# Patient Record
Sex: Male | Born: 1965 | Race: Black or African American | Hispanic: No | Marital: Married | Smoking: Never smoker
Health system: Southern US, Community
[De-identification: ages and names within clinical notes are randomized; demographics above are authoritative.]

## PROBLEM LIST (undated history)

## (undated) DIAGNOSIS — E785 Hyperlipidemia, unspecified: Secondary | ICD-10-CM

## (undated) DIAGNOSIS — S02609B Fracture of mandible, unspecified, initial encounter for open fracture: Secondary | ICD-10-CM

## (undated) DIAGNOSIS — I10 Essential (primary) hypertension: Secondary | ICD-10-CM

## (undated) DIAGNOSIS — E119 Type 2 diabetes mellitus without complications: Secondary | ICD-10-CM

## (undated) HISTORY — PX: FACIAL FRACTURE SURGERY: SHX1570

## (undated) HISTORY — DX: Hyperlipidemia, unspecified: E78.5

## (undated) HISTORY — DX: Essential (primary) hypertension: I10

## (undated) HISTORY — DX: Type 2 diabetes mellitus without complications: E11.9

## (undated) HISTORY — PX: KNEE ARTHROSCOPY: SUR90

---

## 1998-02-07 ENCOUNTER — Emergency Department (HOSPITAL_COMMUNITY): Admission: EM | Admit: 1998-02-07 | Discharge: 1998-02-07 | Payer: Self-pay | Admitting: Emergency Medicine

## 2001-12-21 ENCOUNTER — Emergency Department (HOSPITAL_COMMUNITY): Admission: EM | Admit: 2001-12-21 | Discharge: 2001-12-21 | Payer: Self-pay | Admitting: Emergency Medicine

## 2002-09-21 ENCOUNTER — Emergency Department (HOSPITAL_COMMUNITY): Admission: EM | Admit: 2002-09-21 | Discharge: 2002-09-21 | Payer: Self-pay | Admitting: Psychiatry

## 2008-02-14 ENCOUNTER — Emergency Department (HOSPITAL_COMMUNITY): Admission: EM | Admit: 2008-02-14 | Discharge: 2008-02-15 | Payer: Self-pay | Admitting: Emergency Medicine

## 2008-02-19 ENCOUNTER — Emergency Department (HOSPITAL_COMMUNITY): Admission: EM | Admit: 2008-02-19 | Discharge: 2008-02-19 | Payer: Self-pay | Admitting: Emergency Medicine

## 2008-08-15 ENCOUNTER — Emergency Department (HOSPITAL_COMMUNITY): Admission: EM | Admit: 2008-08-15 | Discharge: 2008-08-15 | Payer: Self-pay | Admitting: Emergency Medicine

## 2008-09-08 ENCOUNTER — Emergency Department (HOSPITAL_COMMUNITY): Admission: EM | Admit: 2008-09-08 | Discharge: 2008-09-08 | Payer: Self-pay | Admitting: Emergency Medicine

## 2009-07-19 IMAGING — CT CT ABDOMEN W/O CM
2 of 4 series · 17 of 46 positions shown, 19 images · non-contrast
Comparison: None

CT ABDOMEN

CLINICAL DATA: Right flank pain.

CT ABDOMEN AND PELVIS WITHOUT CONTRAST
TECHNIQUE: Multidetector CT imaging of the abdomen and pelvis was
performed following the standard protocol without intravenous
contrast.

[Series 2: 220 stone 5.0 b40f st · axial · 0.66mm/px · z∈[+829,+1194]mm · 14 of 81 slices shown, 16 images]
[im 4/81  soft-tissue]
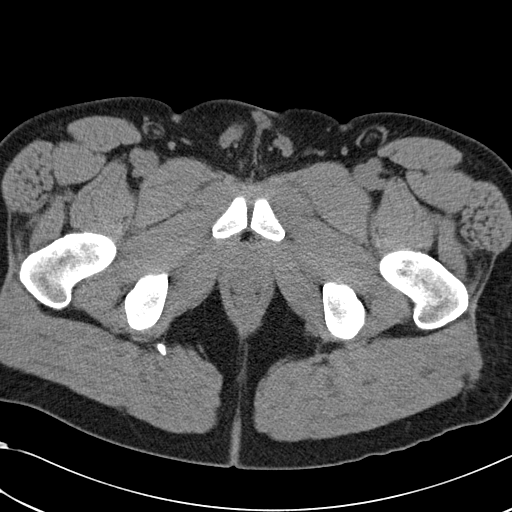
[im 4/81  bone]
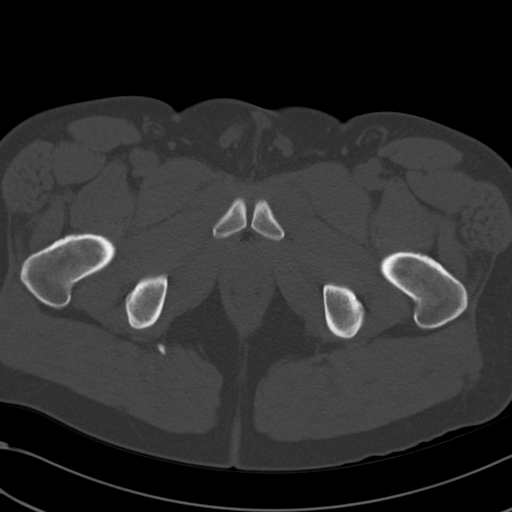
[im 10/81  soft-tissue]
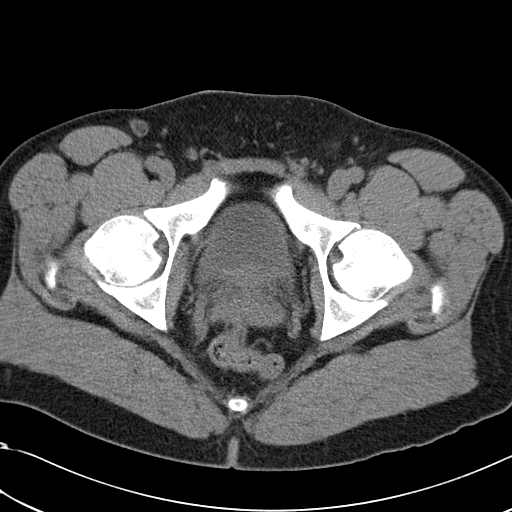
[im 17/81  soft-tissue]
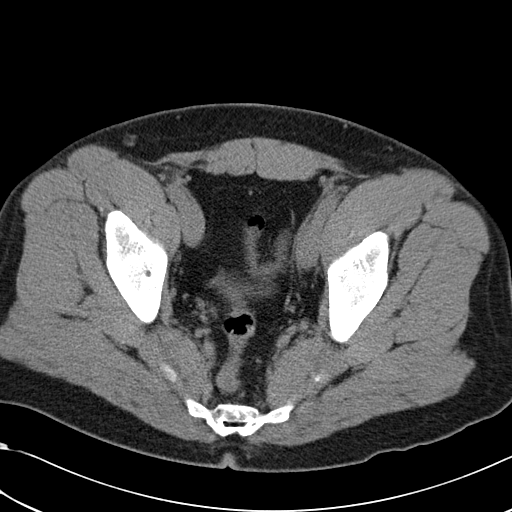
[im 23/81  soft-tissue]
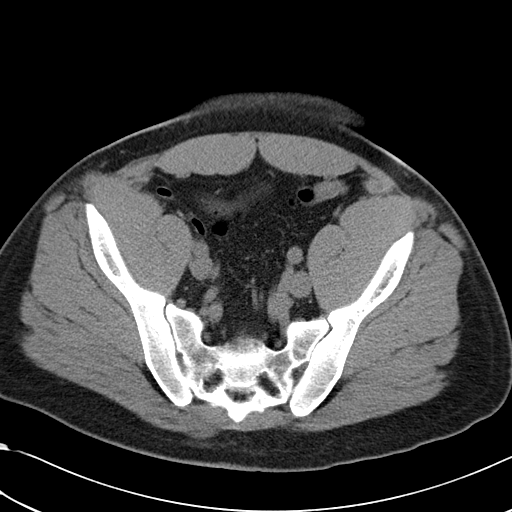
[im 26/81  soft-tissue]
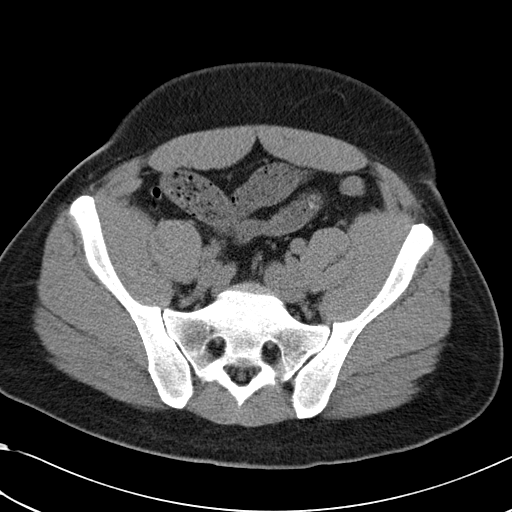
[im 33/81  soft-tissue]
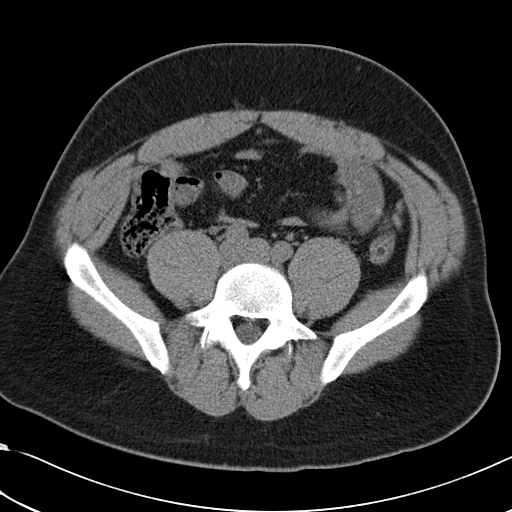
[im 39/81  soft-tissue]
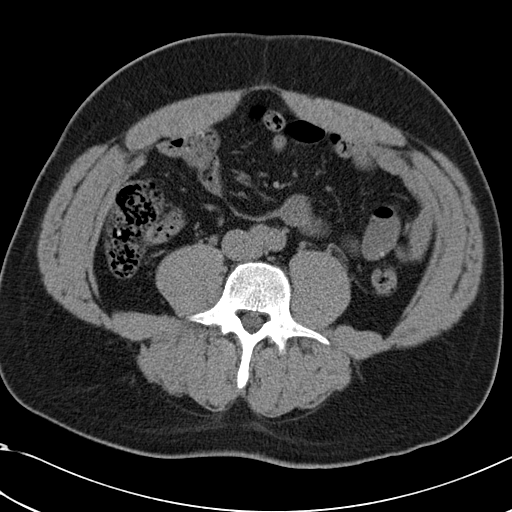
[im 42/81  soft-tissue]
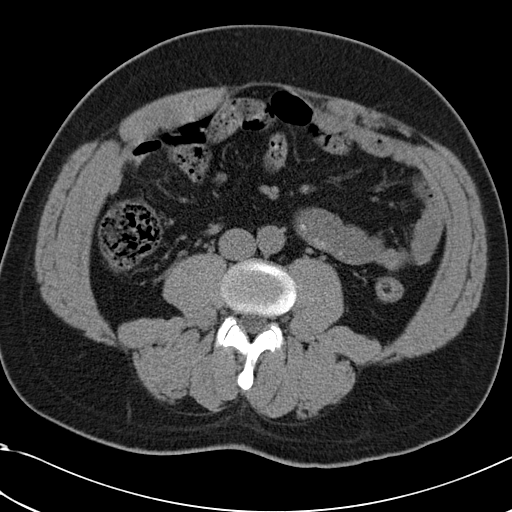
[im 49/81  soft-tissue]
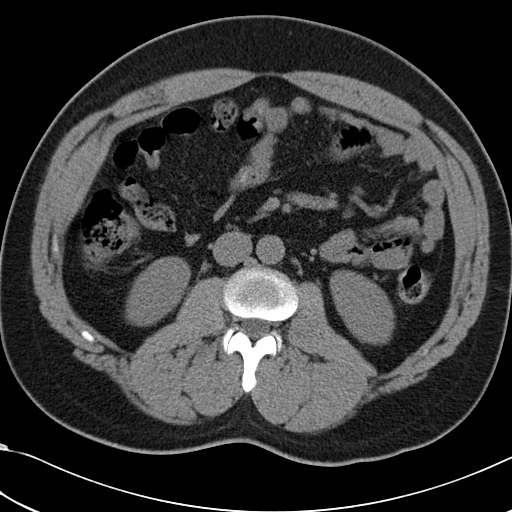
[im 49/81  bone]
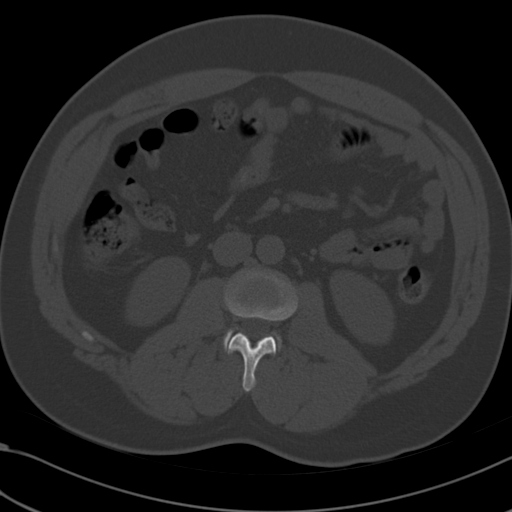
[im 55/81  soft-tissue]
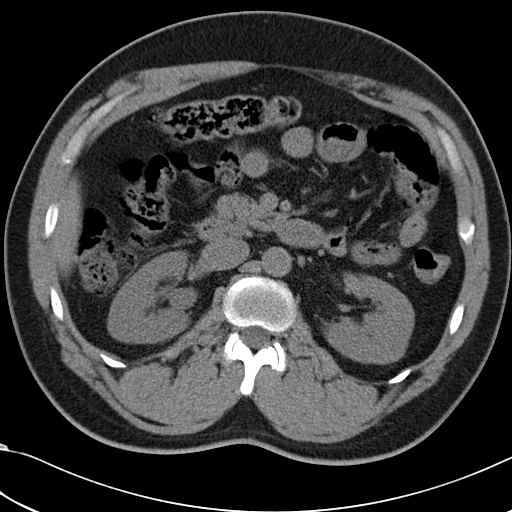
[im 61/81  soft-tissue]
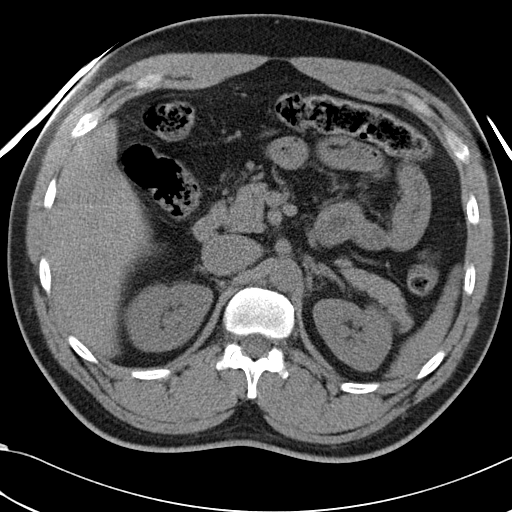
[im 65/81  soft-tissue]
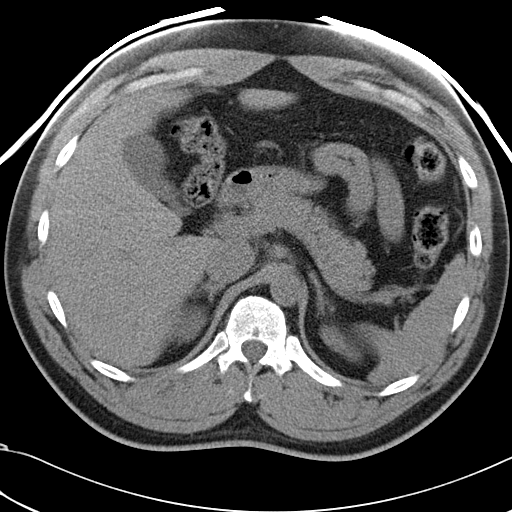
[im 71/81  soft-tissue]
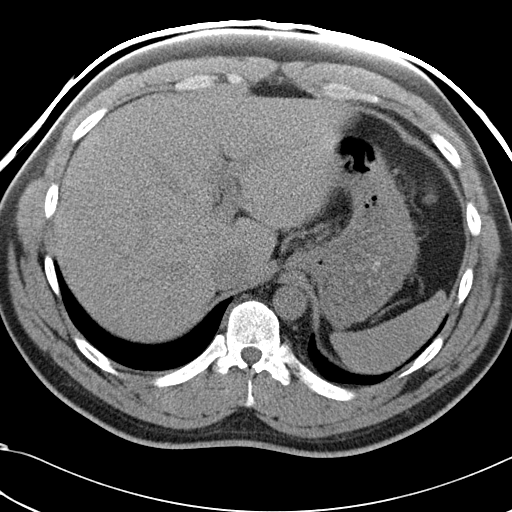
[im 77/81  soft-tissue]
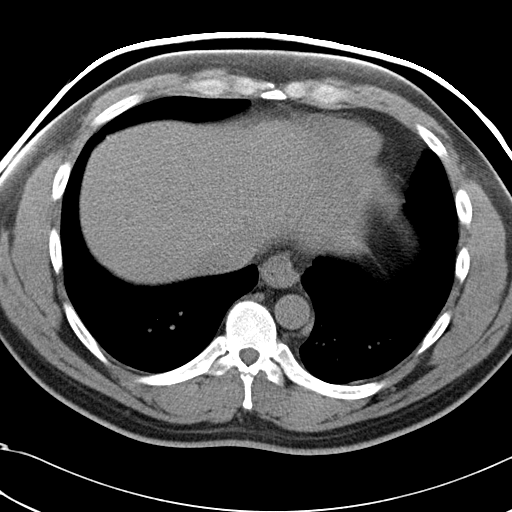

[Series 602: <mpr thick range> · coronal · 0.82mm/px · 3 of 78 slices shown]
[im 26/78  soft-tissue]
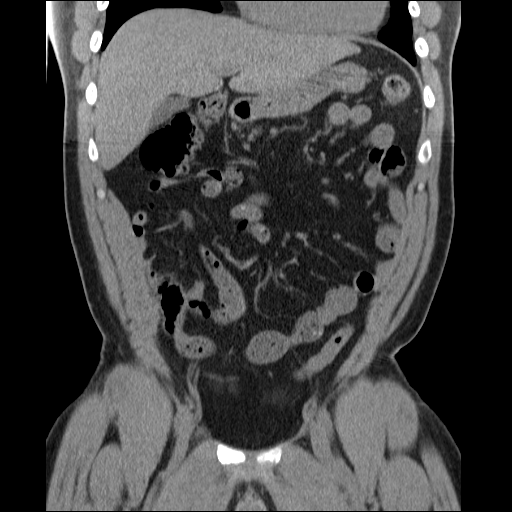
[im 35/78  soft-tissue]
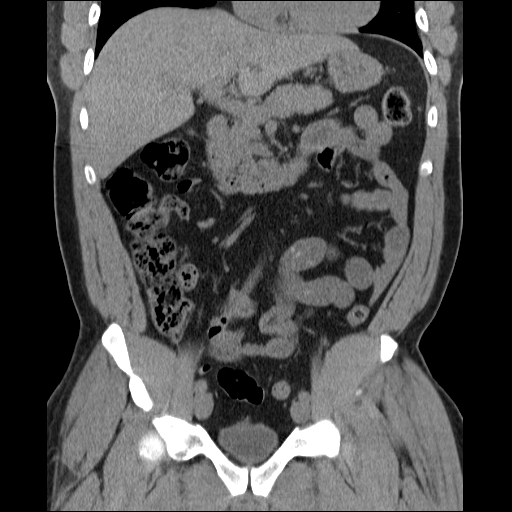
[im 43/78  soft-tissue]
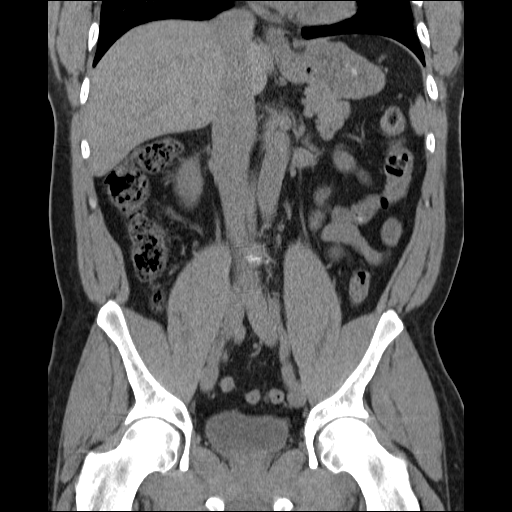

[17 of 46 positions shown; findings below may reference images not displayed]

FINDINGS: The lung bases are clear

The unenhanced appearance of the liver and spleen are unremarkable.
The pancreas is grossly normal.  The adrenal glands and left kidney
are normal.  The right kidney demonstrates mild hydronephrosis and
there is a small lower pole right renal calculus.  Mild right
hydroureter.  No upper ureteral calculi.

The stomach, duodenum, small bowel and colon demonstrate no
significant abnormalities.  The study is limited without oral
contrast.  No mesenteric or retroperitoneal masses or adenopathy.
The aorta is normal in caliber.  No significant bony findings.
IMPRESSION: 1.  Mild right-sided hydroureteronephrosis consistent with a low
grade obstruction.
2.  Small lower pole right renal calculus.
3.  No mass lesions or adenopathy.
4.  Small hiatal hernia.

CT PELVIS
FINDINGS: There is a tiny, 1- 2 mm calculus at the right UVJ
causing the mild right-sided hydroureteronephrosis.  No bladder
calculi.  The rectum, sigmoid colon visualized small bowel loops
unremarkable.  The appendix is visualized and is normal.  The bony
pelvis is intact.  No inguinal mass or hernia.
IMPRESSION: 1-2 mm right UVJ calculus causing mild right-sided
hydroureteronephrosis.

## 2010-09-19 ENCOUNTER — Emergency Department (HOSPITAL_COMMUNITY)
Admission: EM | Admit: 2010-09-19 | Discharge: 2010-09-19 | Disposition: A | Discharge status: Home / Self Care | Payer: PRIVATE HEALTH INSURANCE | Attending: Emergency Medicine | Admitting: Emergency Medicine

## 2010-09-19 DIAGNOSIS — R109 Unspecified abdominal pain: Secondary | ICD-10-CM | POA: Insufficient documentation

## 2010-09-19 DIAGNOSIS — R110 Nausea: Secondary | ICD-10-CM | POA: Insufficient documentation

## 2010-09-19 DIAGNOSIS — N23 Unspecified renal colic: Secondary | ICD-10-CM | POA: Insufficient documentation

## 2010-09-19 LAB — POCT I-STAT, CHEM 8
BUN: 19 mg/dL (ref 6–23)
Calcium, Ion: 1.2 mmol/L (ref 1.12–1.32)
Chloride: 104 meq/L (ref 96–112)
Creatinine, Ser: 1.3 mg/dL (ref 0.4–1.5)
Glucose, Bld: 91 mg/dL (ref 70–99)
HCT: 50 % (ref 39.0–52.0)
Hemoglobin: 17 g/dL (ref 13.0–17.0)
Potassium: 4.2 meq/L (ref 3.5–5.1)
Sodium: 141 meq/L (ref 135–145)
TCO2: 28 mmol/L (ref 0–100)

## 2010-09-19 LAB — URINALYSIS, ROUTINE W REFLEX MICROSCOPIC
Bilirubin Urine: NEGATIVE
Hgb urine dipstick: NEGATIVE
Protein, ur: NEGATIVE mg/dL
Urobilinogen, UA: 1 mg/dL (ref 0.0–1.0)

## 2010-09-19 LAB — CBC
HCT: 46.9 % (ref 39.0–52.0)
Hemoglobin: 15.2 g/dL (ref 13.0–17.0)
MCH: 26.3 pg (ref 26.0–34.0)
MCHC: 32.4 g/dL (ref 30.0–36.0)
MCV: 81.3 fL (ref 78.0–100.0)
Platelets: 244 K/uL (ref 150–400)
RBC: 5.77 MIL/uL (ref 4.22–5.81)
RDW: 13.3 % (ref 11.5–15.5)
WBC: 7.1 K/uL (ref 4.0–10.5)

## 2010-09-19 LAB — DIFFERENTIAL
Basophils Absolute: 0 10*3/uL (ref 0.0–0.1)
Eosinophils Relative: 1 % (ref 0–5)
Lymphocytes Relative: 42 % (ref 12–46)
Monocytes Absolute: 0.5 10*3/uL (ref 0.1–1.0)

## 2010-09-21 ENCOUNTER — Emergency Department (HOSPITAL_COMMUNITY): Payer: PRIVATE HEALTH INSURANCE

## 2010-09-21 ENCOUNTER — Emergency Department (HOSPITAL_COMMUNITY)
Admission: EM | Admit: 2010-09-21 | Discharge: 2010-09-21 | Disposition: A | Discharge status: Home / Self Care | Payer: PRIVATE HEALTH INSURANCE | Attending: Emergency Medicine | Admitting: Emergency Medicine

## 2010-09-21 DIAGNOSIS — R109 Unspecified abdominal pain: Secondary | ICD-10-CM | POA: Insufficient documentation

## 2010-09-21 DIAGNOSIS — M79609 Pain in unspecified limb: Secondary | ICD-10-CM | POA: Insufficient documentation

## 2010-09-21 DIAGNOSIS — R209 Unspecified disturbances of skin sensation: Secondary | ICD-10-CM | POA: Insufficient documentation

## 2010-09-21 DIAGNOSIS — R112 Nausea with vomiting, unspecified: Secondary | ICD-10-CM | POA: Insufficient documentation

## 2010-09-21 DIAGNOSIS — M25559 Pain in unspecified hip: Secondary | ICD-10-CM | POA: Insufficient documentation

## 2010-09-21 LAB — URINALYSIS, ROUTINE W REFLEX MICROSCOPIC
Bilirubin Urine: NEGATIVE
Hgb urine dipstick: NEGATIVE
Ketones, ur: NEGATIVE mg/dL
Protein, ur: NEGATIVE mg/dL
Urine Glucose, Fasting: NEGATIVE mg/dL

## 2010-11-14 LAB — POCT I-STAT, CHEM 8
BUN: 24 mg/dL — ABNORMAL HIGH (ref 6–23)
Calcium, Ion: 1.17 mmol/L (ref 1.12–1.32)
Creatinine, Ser: 1.6 mg/dL — ABNORMAL HIGH (ref 0.4–1.5)
TCO2: 28 mmol/L (ref 0–100)

## 2011-01-05 ENCOUNTER — Emergency Department (HOSPITAL_COMMUNITY)
Admission: EM | Admit: 2011-01-05 | Discharge: 2011-01-05 | Disposition: A | Discharge status: Home / Self Care | Payer: PRIVATE HEALTH INSURANCE | Attending: Emergency Medicine | Admitting: Emergency Medicine

## 2011-01-05 DIAGNOSIS — R319 Hematuria, unspecified: Secondary | ICD-10-CM | POA: Insufficient documentation

## 2011-01-05 DIAGNOSIS — R109 Unspecified abdominal pain: Secondary | ICD-10-CM | POA: Insufficient documentation

## 2011-01-05 DIAGNOSIS — N200 Calculus of kidney: Secondary | ICD-10-CM | POA: Insufficient documentation

## 2011-01-05 LAB — URINE MICROSCOPIC-ADD ON

## 2011-01-05 LAB — URINALYSIS, ROUTINE W REFLEX MICROSCOPIC
Glucose, UA: NEGATIVE mg/dL
Hgb urine dipstick: NEGATIVE
Ketones, ur: NEGATIVE mg/dL
Protein, ur: NEGATIVE mg/dL

## 2011-04-27 LAB — BASIC METABOLIC PANEL
BUN: 14
Chloride: 106
Glucose, Bld: 99
Potassium: 4.3
Sodium: 140

## 2011-04-27 LAB — MAGNESIUM: Magnesium: 1.9

## 2013-01-07 ENCOUNTER — Telehealth: Payer: Self-pay

## 2013-01-07 ENCOUNTER — Ambulatory Visit: Payer: Self-pay | Admitting: Family Medicine

## 2013-01-07 VITALS — BP 111/75 | HR 90 | Temp 98.3°F | Resp 16 | Ht 68.5 in | Wt 220.0 lb

## 2013-01-07 DIAGNOSIS — T148XXA Other injury of unspecified body region, initial encounter: Secondary | ICD-10-CM

## 2013-01-07 NOTE — Progress Notes (Signed)
[facility]:  Office Visit  Chief Complaint:  Chief Complaint  Patient presents with  . Leg Pain    felt a pop    HPI: Billy Rios is a 47 y.o. male who complains of  Softball tournament, was chasing after another player and heard something popped and was not able to move. RICE, no swelling. 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 Pain with weightbearing and walking. Can dorsi and plantarflex  History reviewed. No pertinent past medical history. History reviewed. No pertinent past surgical history. History   Social History  . Marital Status: Married    Spouse Name: N/A    Number of Children: N/A  . Years of Education: N/A   Social History Main Topics  . Smoking status: Never Smoker   . Smokeless tobacco: None  . Alcohol Use: No  . Drug Use: No  . Sexually Active: Yes    Birth Control/ Protection: None   Other Topics Concern  . None   Social History Narrative  . None   History reviewed. No pertinent family history. No Known Allergies Prior to Admission medications   Not on File     ROS: The patient denies fevers, chills, night sweats, unintentional weight loss, chest pain, palpitations, wheezing, dyspnea on exertion, nausea, vomiting, abdominal pain, dysuria, hematuria, melena, numbness,  or tingling.   All other systems have been reviewed and were otherwise negative with the exception of those mentioned in the HPI and as above.    PHYSICAL EXAM: Filed Vitals:   01/07/13 1126  BP: 111/75  Pulse: 90  Temp: 98.3 F (36.8 C)  Resp: 16   Filed Vitals:   01/07/13 1126  Height: 5' 8.5" (1.74 m)  Weight: 220 lb (99.791 kg)   Body mass index is 32.96 kg/(m^2).  General: Alert, no acute distress HEENT:  Normocephalic, atraumatic, oropharynx patent.  Cardiovascular:  Regular rate and rhythm, no rubs murmurs or gallops.  No Carotid bruits, radial pulse intact. No pedal edema.  Respiratory: Clear to auscultation bilaterally.  No wheezes,  rales, or rhonchi.  No cyanosis, no use of accessory musculature GI: No organomegaly, abdomen is soft and non-tender, positive bowel sounds.  No masses. Skin: No rashes. Neurologic: Facial musculature symmetric. Psychiatric: Patient is appropriate throughout our interaction. Lymphatic: No cervical lymphadenopathy Musculoskeletal: Gait intact with help of crutches, no obvious swelling + tenderness medal aspect at achilles tendon insertion at gastroc  + DP, 5/5 stregnth, able to dorsi and plantar flex, able to bear weight and shift.    LABS: Results for orders placed during the hospital encounter of 01/05/11  URINALYSIS, ROUTINE W REFLEX MICROSCOPIC      Result Value Range   Color, Urine YELLOW  YELLOW   APPearance TURBID (*) CLEAR   Specific Gravity, Urine 1.034 (*) 1.005 - 1.030   pH 5.5  5.0 - 8.0   Glucose, UA NEGATIVE  NEGATIVE mg/dL   Hgb urine dipstick NEGATIVE  NEGATIVE   Bilirubin Urine NEGATIVE  NEGATIVE   Ketones, ur NEGATIVE  NEGATIVE mg/dL   Protein, ur NEGATIVE  NEGATIVE mg/dL   Urobilinogen, UA 0.2  0.0 - 1.0 mg/dL   Nitrite NEGATIVE  NEGATIVE   Leukocytes, UA NEGATIVE  NEGATIVE  URINE MICROSCOPIC-ADD ON      Result Value Range   Urine-Other AMORPHOUS URATES/PHOSPHATES       EKG/XRAY:   Primary read interpreted by Dr. Conley Rolls at [facility].   ASSESSMENT/PLAN: Encounter Diagnosis  Name Primary?  . Sprain  and strain Yes   ? Gastro strain/sprain Unlikely achilles rupture Compressions ICE Ibuprofen NSAIDs  Nonweightbearing, note given for 1 week Xrays deferred unless worsening sxs since no insurance F/u prn    Shamar Engelmann PHUONG, DO 01/07/2013 12:02 PM

## 2013-01-07 NOTE — Telephone Encounter (Signed)
Patient would like an itemized bill of their visit on 01/07/13 mailed to their house. Current address correct in Epic: [id number] , [place] [address].

## 2013-01-10 NOTE — Telephone Encounter (Signed)
Done

## 2015-06-29 ENCOUNTER — Encounter (HOSPITAL_COMMUNITY): Payer: Self-pay | Admitting: Emergency Medicine

## 2015-06-29 ENCOUNTER — Emergency Department (HOSPITAL_COMMUNITY): Payer: No Typology Code available for payment source

## 2015-06-29 ENCOUNTER — Emergency Department (HOSPITAL_COMMUNITY)
Admission: EM | Admit: 2015-06-29 | Discharge: 2015-06-29 | Disposition: A | Discharge status: Home / Self Care | Payer: No Typology Code available for payment source | Attending: Emergency Medicine | Admitting: Emergency Medicine

## 2015-06-29 DIAGNOSIS — Y9389 Activity, other specified: Secondary | ICD-10-CM | POA: Diagnosis not present

## 2015-06-29 DIAGNOSIS — Y998 Other external cause status: Secondary | ICD-10-CM | POA: Diagnosis not present

## 2015-06-29 DIAGNOSIS — S79912A Unspecified injury of left hip, initial encounter: Secondary | ICD-10-CM | POA: Insufficient documentation

## 2015-06-29 DIAGNOSIS — T148XXA Other injury of unspecified body region, initial encounter: Secondary | ICD-10-CM

## 2015-06-29 DIAGNOSIS — Y9289 Other specified places as the place of occurrence of the external cause: Secondary | ICD-10-CM | POA: Diagnosis not present

## 2015-06-29 DIAGNOSIS — S39012A Strain of muscle, fascia and tendon of lower back, initial encounter: Secondary | ICD-10-CM

## 2015-06-29 DIAGNOSIS — S3992XA Unspecified injury of lower back, initial encounter: Secondary | ICD-10-CM | POA: Diagnosis present

## 2015-06-29 MED ORDER — MELOXICAM 7.5 MG PO TABS: 15.0000 mg | ORAL_TABLET | Freq: Every day | ORAL | Status: DC | PRN

## 2015-06-29 MED ORDER — MORPHINE SULFATE (PF) 4 MG/ML IV SOLN
4.0000 mg | INTRAVENOUS | Status: DC | PRN
Start: 2015-06-29 — End: 2015-06-29
  Administered 2015-06-29: 4 mg via INTRAVENOUS
  Filled 2015-06-29: qty 1

## 2015-06-29 NOTE — ED Notes (Signed)
MD at bedside. 

## 2015-06-29 NOTE — ED Notes (Signed)
Attempted to ambulate pt pt very groggy will retry to ambulate once pt is more alert RN aware

## 2015-06-29 NOTE — ED Notes (Signed)
Patient transported to X-ray 
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
## 2015-06-29 NOTE — Discharge Instructions (Signed)

## 2015-07-05 ENCOUNTER — Emergency Department (HOSPITAL_COMMUNITY): Payer: No Typology Code available for payment source

## 2015-07-05 ENCOUNTER — Encounter (HOSPITAL_COMMUNITY): Payer: Self-pay | Admitting: Emergency Medicine

## 2015-07-05 ENCOUNTER — Emergency Department (HOSPITAL_COMMUNITY)
Admission: EM | Admit: 2015-07-05 | Discharge: 2015-07-05 | Disposition: A | Discharge status: Home / Self Care | Payer: No Typology Code available for payment source | Attending: Emergency Medicine | Admitting: Emergency Medicine

## 2015-07-05 DIAGNOSIS — Z8781 Personal history of (healed) traumatic fracture: Secondary | ICD-10-CM | POA: Diagnosis not present

## 2015-07-05 DIAGNOSIS — R202 Paresthesia of skin: Secondary | ICD-10-CM | POA: Insufficient documentation

## 2015-07-05 DIAGNOSIS — M545 Low back pain: Secondary | ICD-10-CM | POA: Diagnosis present

## 2015-07-05 DIAGNOSIS — R200 Anesthesia of skin: Secondary | ICD-10-CM | POA: Insufficient documentation

## 2015-07-05 DIAGNOSIS — R531 Weakness: Secondary | ICD-10-CM | POA: Insufficient documentation

## 2015-07-05 DIAGNOSIS — M5442 Lumbago with sciatica, left side: Secondary | ICD-10-CM

## 2015-07-05 DIAGNOSIS — R32 Unspecified urinary incontinence: Secondary | ICD-10-CM | POA: Insufficient documentation

## 2015-07-05 HISTORY — DX: Fracture of mandible, unspecified, initial encounter for open fracture: S02.609B

## 2015-07-05 LAB — CBC
HCT: 47 % (ref 39.0–52.0)
HEMOGLOBIN: 15.1 g/dL (ref 13.0–17.0)
MCH: 26.2 pg (ref 26.0–34.0)
MCHC: 32.1 g/dL (ref 30.0–36.0)
MCV: 81.6 fL (ref 78.0–100.0)
Platelets: 240 10*3/uL (ref 150–400)
RBC: 5.76 MIL/uL (ref 4.22–5.81)
RDW: 13.7 % (ref 11.5–15.5)
WBC: 7.2 10*3/uL (ref 4.0–10.5)

## 2015-07-05 LAB — BASIC METABOLIC PANEL
Anion gap: 8 (ref 5–15)
BUN: 12 mg/dL (ref 6–20)
CALCIUM: 9.2 mg/dL (ref 8.9–10.3)
CHLORIDE: 106 mmol/L (ref 101–111)
CO2: 26 mmol/L (ref 22–32)
CREATININE: 1.07 mg/dL (ref 0.61–1.24)
GFR calc non Af Amer: >60 mL/min (ref >60)
Glucose, Bld: 128 mg/dL — ABNORMAL HIGH (ref 65–99)
Potassium: 4.1 mmol/L (ref 3.5–5.1)
SODIUM: 140 mmol/L (ref 135–145)

## 2015-07-05 MED ORDER — PREDNISONE 20 MG PO TABS: 40.0000 mg | ORAL_TABLET | Freq: Every day | ORAL | Status: DC

## 2015-07-05 MED ORDER — HYDROCODONE-ACETAMINOPHEN 5-325 MG PO TABS
2.0000 | ORAL_TABLET | Freq: Once | ORAL | Status: AC
  Administered 2015-07-05: 2 via ORAL
  Filled 2015-07-05: qty 2

## 2015-07-05 MED ORDER — HYDROCODONE-ACETAMINOPHEN 5-325 MG PO TABS: 1.0000 | ORAL_TABLET | Freq: Four times a day (QID) | ORAL | Status: DC | PRN

## 2015-07-05 MED ORDER — CYCLOBENZAPRINE HCL 10 MG PO TABS: 10.0000 mg | ORAL_TABLET | Freq: Two times a day (BID) | ORAL | Status: DC | PRN
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
## 2015-07-05 NOTE — ED Notes (Signed)
Pt returned from MRI °

## 2015-07-05 NOTE — Discharge Instructions (Signed)
Sciatica With Rehab The sciatic nerve runs from the back down the leg and is responsible for sensation and control of the muscles in the back (posterior) side of the thigh, lower leg, and foot. Sciatica is a condition that is characterized by inflammation of this nerve.  SYMPTOMS   Signs of nerve damage, including numbness and/or weakness along the posterior side of the lower extremity.  Pain in the back of the thigh that may also travel down the leg.  Pain that worsens when sitting for long periods of time.  Occasionally, pain in the back or buttock. CAUSES  Inflammation of the sciatic nerve is the cause of sciatica. The inflammation is due to something irritating the nerve. Common sources of irritation include:  Sitting for long periods of time.  Direct trauma to the nerve.  Arthritis of the spine.  Herniated or ruptured disk.  Slipping of the vertebrae (spondylolisthesis).  Pressure from soft tissues, such as muscles or ligament-like tissue (fascia). RISK INCREASES WITH:  Sports that place pressure or stress on the spine (football or weightlifting).  Poor strength and flexibility.  Failure to warm up properly before activity.  Family history of low back pain or disk disorders.  Previous back injury or surgery.  Poor body mechanics, especially when lifting, or poor posture. PREVENTION   Warm up and stretch properly before activity.  Maintain physical fitness:  Strength, flexibility, and endurance.  Cardiovascular fitness.  Learn and use proper technique, especially with posture and lifting. When possible, have coach correct improper technique.  Avoid activities that place stress on the spine. PROGNOSIS If treated properly, then sciatica usually resolves within 6 weeks. However, occasionally surgery is necessary.  RELATED COMPLICATIONS   Permanent nerve damage, including pain, numbness, tingle, or weakness.  Chronic back pain.  Risks of surgery: infection,  bleeding, nerve damage, or damage to surrounding tissues. TREATMENT Treatment initially involves resting from any activities that aggravate your symptoms. The use of ice and medication may help reduce pain and inflammation. The use of strengthening and stretching exercises may help reduce pain with activity. These exercises may be performed at home or with referral to a therapist. A therapist may recommend further treatments, such as transcutaneous electronic nerve stimulation (TENS) or ultrasound. Your caregiver may recommend corticosteroid injections to help reduce inflammation of the sciatic nerve. If symptoms persist despite non-surgical (conservative) treatment, then surgery may be recommended. MEDICATION  If pain medication is necessary, then nonsteroidal anti-inflammatory medications, such as aspirin and ibuprofen, or other minor pain relievers, such as acetaminophen, are often recommended.  Do not take pain medication for 7 days before surgery.  Prescription pain relievers may be given if deemed necessary by your caregiver. Use only as directed and only as much as you need.  Ointments applied to the skin may be helpful.  Corticosteroid injections may be given by your caregiver. These injections should be reserved for the most serious cases, because they may only be given a certain number of times. HEAT AND COLD  Cold treatment (icing) relieves pain and reduces inflammation. Cold treatment should be applied for 10 to 15 minutes every 2 to 3 hours for inflammation and pain and immediately after any activity that aggravates your symptoms. Use ice packs or massage the area with a piece of ice (ice massage).  Heat treatment may be used prior to performing the stretching and strengthening activities prescribed by your caregiver, physical therapist, or athletic trainer. Use a heat pack or soak the injury in warm water.   SEEK MEDICAL CARE IF:  Treatment seems to offer no benefit, or the condition  worsens.  Any medications produce adverse side effects. EXERCISES  RANGE OF MOTION (ROM) AND STRETCHING EXERCISES - Sciatica Most people with sciatic will find that their symptoms worsen with either excessive bending forward (flexion) or arching at the low back (extension). The exercises which will help resolve your symptoms will focus on the opposite motion. Your physician, physical therapist or athletic trainer will help you determine which exercises will be most helpful to resolve your low back pain. Do not complete any exercises without first consulting with your clinician. Discontinue any exercises which worsen your symptoms until you speak to your clinician. If you have pain, numbness or tingling which travels down into your buttocks, leg or foot, the goal of the therapy is for these symptoms to move closer to your back and eventually resolve. Occasionally, these leg symptoms will get better, but your low back pain may worsen; this is typically an indication of progress in your rehabilitation. Be certain to be very alert to any changes in your symptoms and the activities in which you participated in the 24 hours prior to the change. Sharing this information with your clinician will allow him/her to most efficiently treat your condition. These exercises may help you when beginning to rehabilitate your injury. Your symptoms may resolve with or without further involvement from your physician, physical therapist or athletic trainer. While completing these exercises, remember:   Restoring tissue flexibility helps normal motion to return to the joints. This allows healthier, less painful movement and activity.  An effective stretch should be held for at least 30 seconds.  A stretch should never be painful. You should only feel a gentle lengthening or release in the stretched tissue. FLEXION RANGE OF MOTION AND STRETCHING EXERCISES: STRETCH - Flexion, Single Knee to Chest   Lie on a firm bed or floor  with both legs extended in front of you.  Keeping one leg in contact with the floor, bring your opposite knee to your chest. Hold your leg in place by either grabbing behind your thigh or at your knee.  Pull until you feel a gentle stretch in your low back. Hold __________ seconds.  Slowly release your grasp and repeat the exercise with the opposite side. Repeat __________ times. Complete this exercise __________ times per day.  STRETCH - Flexion, Double Knee to Chest  Lie on a firm bed or floor with both legs extended in front of you.  Keeping one leg in contact with the floor, bring your opposite knee to your chest.  Tense your stomach muscles to support your back and then lift your other knee to your chest. Hold your legs in place by either grabbing behind your thighs or at your knees.  Pull both knees toward your chest until you feel a gentle stretch in your low back. Hold __________ seconds.  Tense your stomach muscles and slowly return one leg at a time to the floor. Repeat __________ times. Complete this exercise __________ times per day.  STRETCH - Low Trunk Rotation   Lie on a firm bed or floor. Keeping your legs in front of you, bend your knees so they are both pointed toward the ceiling and your feet are flat on the floor.  Extend your arms out to the side. This will stabilize your upper body by keeping your shoulders in contact with the floor.  Gently and slowly drop both knees together to one side until   you feel a gentle stretch in your low back. Hold for __________ seconds.  Tense your stomach muscles to support your low back as you bring your knees back to the starting position. Repeat the exercise to the other side. Repeat __________ times. Complete this exercise __________ times per day  EXTENSION RANGE OF MOTION AND FLEXIBILITY EXERCISES: STRETCH - Extension, Prone on Elbows  Lie on your stomach on the floor, a bed will be too soft. Place your palms about shoulder  width apart and at the height of your head.  Place your elbows under your shoulders. If this is too painful, stack pillows under your chest.  Allow your body to relax so that your hips drop lower and make contact more completely with the floor.  Hold this position for __________ seconds.  Slowly return to lying flat on the floor. Repeat __________ times. Complete this exercise __________ times per day.  RANGE OF MOTION - Extension, Prone Press Ups  Lie on your stomach on the floor, a bed will be too soft. Place your palms about shoulder width apart and at the height of your head.  Keeping your back as relaxed as possible, slowly straighten your elbows while keeping your hips on the floor. You may adjust the placement of your hands to maximize your comfort. As you gain motion, your hands will come more underneath your shoulders.  Hold this position __________ seconds.  Slowly return to lying flat on the floor. Repeat __________ times. Complete this exercise __________ times per day.  STRENGTHENING EXERCISES - Sciatica  These exercises may help you when beginning to rehabilitate your injury. These exercises should be done near your "sweet spot." This is the neutral, low-back arch, somewhere between fully rounded and fully arched, that is your least painful position. When performed in this safe range of motion, these exercises can be used for people who have either a flexion or extension based injury. These exercises may resolve your symptoms with or without further involvement from your physician, physical therapist or athletic trainer. While completing these exercises, remember:   Muscles can gain both the endurance and the strength needed for everyday activities through controlled exercises.  Complete these exercises as instructed by your physician, physical therapist or athletic trainer. Progress with the resistance and repetition exercises only as your caregiver advises.  You may  experience muscle soreness or fatigue, but the pain or discomfort you are trying to eliminate should never worsen during these exercises. If this pain does worsen, stop and make certain you are following the directions exactly. If the pain is still present after adjustments, discontinue the exercise until you can discuss the trouble with your clinician. STRENGTHENING - Deep Abdominals, Pelvic Tilt   Lie on a firm bed or floor. Keeping your legs in front of you, bend your knees so they are both pointed toward the ceiling and your feet are flat on the floor.  Tense your lower abdominal muscles to press your low back into the floor. This motion will rotate your pelvis so that your tail bone is scooping upwards rather than pointing at your feet or into the floor.  With a gentle tension and even breathing, hold this position for __________ seconds. Repeat __________ times. Complete this exercise __________ times per day.  STRENGTHENING - Abdominals, Crunches   Lie on a firm bed or floor. Keeping your legs in front of you, bend your knees so they are both pointed toward the ceiling and your feet are flat on the   floor. Cross your arms over your chest.  Slightly tip your chin down without bending your neck.  Tense your abdominals and slowly lift your trunk high enough to just clear your shoulder blades. Lifting higher can put excessive stress on the low back and does not further strengthen your abdominal muscles.  Control your return to the starting position. Repeat __________ times. Complete this exercise __________ times per day.  STRENGTHENING - Quadruped, Opposite UE/LE Lift  Assume a hands and knees position on a firm surface. Keep your hands under your shoulders and your knees under your hips. You may place padding under your knees for comfort.  Find your neutral spine and gently tense your abdominal muscles so that you can maintain this position. Your shoulders and hips should form a rectangle  that is parallel with the floor and is not twisted.  Keeping your trunk steady, lift your right hand no higher than your shoulder and then your left leg no higher than your hip. Make sure you are not holding your breath. Hold this position __________ seconds.  Continuing to keep your abdominal muscles tense and your back steady, slowly return to your starting position. Repeat with the opposite arm and leg. Repeat __________ times. Complete this exercise __________ times per day.  STRENGTHENING - Abdominals and Quadriceps, Straight Leg Raise   Lie on a firm bed or floor with both legs extended in front of you.  Keeping one leg in contact with the floor, bend the other knee so that your foot can rest flat on the floor.  Find your neutral spine, and tense your abdominal muscles to maintain your spinal position throughout the exercise.  Slowly lift your straight leg off the floor about 6 inches for a count of 15, making sure to not hold your breath.  Still keeping your neutral spine, slowly lower your leg all the way to the floor. Repeat this exercise with each leg __________ times. Complete this exercise __________ times per day. POSTURE AND BODY MECHANICS CONSIDERATIONS - Sciatica Keeping correct posture when sitting, standing or completing your activities will reduce the stress put on different body tissues, allowing injured tissues a chance to heal and limiting painful experiences. The following are general guidelines for improved posture. Your physician or physical therapist will provide you with any instructions specific to your needs. While reading these guidelines, remember:  The exercises prescribed by your provider will help you have the flexibility and strength to maintain correct postures.  The correct posture provides the optimal environment for your joints to work. All of your joints have less wear and tear when properly supported by a spine with good posture. This means you will  experience a healthier, less painful body.  Correct posture must be practiced with all of your activities, especially prolonged sitting and standing. Correct posture is as important when doing repetitive low-stress activities (typing) as it is when doing a single heavy-load activity (lifting). RESTING POSITIONS Consider which positions are most painful for you when choosing a resting position. If you have pain with flexion-based activities (sitting, bending, stooping, squatting), choose a position that allows you to rest in a less flexed posture. You would want to avoid curling into a fetal position on your side. If your pain worsens with extension-based activities (prolonged standing, working overhead), avoid resting in an extended position such as sleeping on your stomach. Most people will find more comfort when they rest with their spine in a more neutral position, neither too rounded nor too   arched. Lying on a non-sagging bed on your side with a pillow between your knees, or on your back with a pillow under your knees will often provide some relief. Keep in mind, being in any one position for a prolonged period of time, no matter how correct your posture, can still lead to stiffness. PROPER SITTING POSTURE In order to minimize stress and discomfort on your spine, you must sit with correct posture Sitting with good posture should be effortless for a healthy body. Returning to good posture is a gradual process. Many people can work toward this most comfortably by using various supports until they have the flexibility and strength to maintain this posture on their own. When sitting with proper posture, your ears will fall over your shoulders and your shoulders will fall over your hips. You should use the back of the chair to support your upper back. Your low back will be in a neutral position, just slightly arched. You may place a small pillow or folded towel at the base of your low back for support.  When  working at a desk, create an environment that supports good, upright posture. Without extra support, muscles fatigue and lead to excessive strain on joints and other tissues. Keep these recommendations in mind: CHAIR:   A chair should be able to slide under your desk when your back makes contact with the back of the chair. This allows you to work closely.  The chair's height should allow your eyes to be level with the upper part of your monitor and your hands to be slightly lower than your elbows. BODY POSITION  Your feet should make contact with the floor. If this is not possible, use a foot rest.  Keep your ears over your shoulders. This will reduce stress on your neck and low back. INCORRECT SITTING POSTURES   If you are feeling tired and unable to assume a healthy sitting posture, do not slouch or slump. This puts excessive strain on your back tissues, causing more damage and pain. Healthier options include:  Using more support, like a lumbar pillow.  Switching tasks to something that requires you to be upright or walking.  Talking a brief walk.  Lying down to rest in a neutral-spine position. PROLONGED STANDING WHILE SLIGHTLY LEANING FORWARD  When completing a task that requires you to lean forward while standing in one place for a long time, place either foot up on a stationary 2-4 inch high object to help maintain the best posture. When both feet are on the ground, the low back tends to lose its slight inward curve. If this curve flattens (or becomes too large), then the back and your other joints will experience too much stress, fatigue more quickly and can cause pain.  CORRECT STANDING POSTURES Proper standing posture should be assumed with all daily activities, even if they only take a few moments, like when brushing your teeth. As in sitting, your ears should fall over your shoulders and your shoulders should fall over your hips. You should keep a slight tension in your abdominal  muscles to brace your spine. Your tailbone should point down to the ground, not behind your body, resulting in an over-extended swayback posture.  INCORRECT STANDING POSTURES  Common incorrect standing postures include a forward head, locked knees and/or an excessive swayback. WALKING Walk with an upright posture. Your ears, shoulders and hips should all line-up. PROLONGED ACTIVITY IN A FLEXED POSITION When completing a task that requires you to bend forward   at your waist or lean over a low surface, try to find a way to stabilize 3 of 4 of your limbs. You can place a hand or elbow on your thigh or rest a knee on the surface you are reaching across. This will provide you more stability so that your muscles do not fatigue as quickly. By keeping your knees relaxed, or slightly bent, you will also reduce stress across your low back. CORRECT LIFTING TECHNIQUES DO :   Assume a wide stance. This will provide you more stability and the opportunity to get as close as possible to the object which you are lifting.  Tense your abdominals to brace your spine; then bend at the knees and hips. Keeping your back locked in a neutral-spine position, lift using your leg muscles. Lift with your legs, keeping your back straight.  Test the weight of unknown objects before attempting to lift them.  Try to keep your elbows locked down at your sides in order get the best strength from your shoulders when carrying an object.  Always ask for help when lifting heavy or awkward objects. INCORRECT LIFTING TECHNIQUES DO NOT:   Lock your knees when lifting, even if it is a small object.  Bend and twist. Pivot at your feet or move your feet when needing to change directions.  Assume that you cannot safely pick up a paperclip without proper posture.   This information is not intended to replace advice given to you by your health care provider. Make sure you discuss any questions you have with your health care provider.     Document Released: 07/16/2005 Document Revised: 11/30/2014 Document Reviewed: 10/28/2008 [employer] Interactive Patient Education 2016 [employer]  

## 2015-07-05 NOTE — ED Notes (Signed)
Pt still at MRI

## 2015-07-05 NOTE — ED Notes (Signed)
Pt in MRI.
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
## 2015-07-05 NOTE — ED Notes (Signed)
Pt transported to MRI 

## 2015-07-05 NOTE — ED Notes (Signed)
Pt verbalized understanding of d/c instructions, prescriptions, and follow-up care. No further questions/concerns, VSS, ambulatory w/ steady gait (refused wheelchair) 

## 2015-08-15 ENCOUNTER — Emergency Department (HOSPITAL_COMMUNITY)
Admission: EM | Admit: 2015-08-15 | Discharge: 2015-08-15 | Disposition: A | Discharge status: Home / Self Care | Payer: No Typology Code available for payment source | Attending: Emergency Medicine | Admitting: Emergency Medicine

## 2015-08-15 ENCOUNTER — Encounter (HOSPITAL_COMMUNITY): Payer: Self-pay | Admitting: Emergency Medicine

## 2015-08-15 DIAGNOSIS — M5126 Other intervertebral disc displacement, lumbar region: Secondary | ICD-10-CM

## 2015-08-15 DIAGNOSIS — L252 Unspecified contact dermatitis due to dyes: Secondary | ICD-10-CM | POA: Diagnosis not present

## 2015-08-15 DIAGNOSIS — Z87828 Personal history of other (healed) physical injury and trauma: Secondary | ICD-10-CM | POA: Insufficient documentation

## 2015-08-15 DIAGNOSIS — Z79899 Other long term (current) drug therapy: Secondary | ICD-10-CM | POA: Diagnosis not present

## 2015-08-15 DIAGNOSIS — Z8781 Personal history of (healed) traumatic fracture: Secondary | ICD-10-CM | POA: Diagnosis not present

## 2015-08-15 DIAGNOSIS — Z7952 Long term (current) use of systemic steroids: Secondary | ICD-10-CM | POA: Insufficient documentation

## 2015-08-15 DIAGNOSIS — L259 Unspecified contact dermatitis, unspecified cause: Secondary | ICD-10-CM

## 2015-08-15 DIAGNOSIS — R21 Rash and other nonspecific skin eruption: Secondary | ICD-10-CM | POA: Diagnosis present

## 2015-08-15 MED ORDER — DEXAMETHASONE SODIUM PHOSPHATE 10 MG/ML IJ SOLN
10.0000 mg | Freq: Once | INTRAMUSCULAR | Status: AC
  Administered 2015-08-15: 10 mg via INTRAVENOUS
  Filled 2015-08-15: qty 1

## 2015-08-15 MED ORDER — SODIUM CHLORIDE 0.9 % IV BOLUS (SEPSIS)
500.0000 mL | Freq: Once | INTRAVENOUS | Status: AC
  Administered 2015-08-15: 500 mL via INTRAVENOUS

## 2015-08-15 MED ORDER — HYDROCORTISONE 1 % EX CREA: TOPICAL_CREAM | CUTANEOUS | Status: DC

## 2015-08-15 MED ORDER — HYDROXYZINE HCL 25 MG PO TABS: 25.0000 mg | ORAL_TABLET | Freq: Four times a day (QID) | ORAL | Status: DC

## 2015-08-15 MED ORDER — DIPHENHYDRAMINE HCL 50 MG/ML IJ SOLN
12.5000 mg | Freq: Once | INTRAMUSCULAR | Status: AC
  Administered 2015-08-15: 12.5 mg via INTRAVENOUS
  Filled 2015-08-15: qty 1
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
## 2015-08-15 NOTE — ED Notes (Signed)
Patient able to dress and ambulate independently 

## 2015-08-15 NOTE — ED Notes (Signed)
Pt developing itching all over-- after decadron given, legs red, no hives noted, no resp distress

## 2015-08-15 NOTE — Discharge Instructions (Signed)
Contact Dermatitis Dermatitis is redness, soreness, and swelling (inflammation) of the skin. Contact dermatitis is a reaction to certain substances that touch the skin. There are two types of contact dermatitis:   Irritant contact dermatitis. This type is caused by something that irritates your skin, such as dry hands from washing them too much. This type does not require previous exposure to the substance for a reaction to occur. This type is more common.  Allergic contact dermatitis. This type is caused by a substance that you are allergic to, such as a nickel allergy or poison ivy. This type only occurs if you have been exposed to the substance (allergen) before. Upon a repeat exposure, your body reacts to the substance. This type is less common. CAUSES  Many different substances can cause contact dermatitis. Irritant contact dermatitis is most commonly caused by exposure to:   Makeup.   Soaps.   Detergents.   Bleaches.   Acids.   Metal salts, such as nickel.  Allergic contact dermatitis is most commonly caused by exposure to:   Poisonous plants.   Chemicals.   Jewelry.   Latex.   Medicines.   Preservatives in products, such as clothing.  RISK FACTORS This condition is more likely to develop in:   People who have jobs that expose them to irritants or allergens.  People who have certain medical conditions, such as asthma or eczema.  SYMPTOMS  Symptoms of this condition may occur anywhere on your body where the irritant has touched you or is touched by you. Symptoms include:  Dryness or flaking.   Redness.   Cracks.   Itching.   Pain or a burning feeling.   Blisters.  Drainage of small amounts of blood or clear fluid from skin cracks. With allergic contact dermatitis, there may also be swelling in areas such as the eyelids, mouth, or genitals.  DIAGNOSIS  This condition is diagnosed with a medical history and physical exam. A patch skin test  may be performed to help determine the cause. If the condition is related to your job, you may need to see an occupational medicine specialist. TREATMENT Treatment for this condition includes figuring out what caused the reaction and protecting your skin from further contact. Treatment may also include:   Steroid creams or ointments. Oral steroid medicines may be needed in more severe cases.  Antibiotics or antibacterial ointments, if a skin infection is present.  Antihistamine lotion or an antihistamine taken by mouth to ease itching.  A bandage (dressing). HOME CARE INSTRUCTIONS Skin Care  Moisturize your skin as needed.   Apply cool compresses to the affected areas.  Try taking a bath with:  Epsom salts. Follow the instructions on the packaging. You can get these at your local pharmacy or grocery store.  Baking soda. Pour a small amount into the bath as directed by your health care provider.  Colloidal oatmeal. Follow the instructions on the packaging. You can get this at your local pharmacy or grocery store.  Try applying baking soda paste to your skin. Stir water into baking soda until it reaches a paste-like consistency.  Do not scratch your skin.  Bathe less frequently, such as every other day.  Bathe in lukewarm water. Avoid using hot water. Medicines  Take or apply over-the-counter and prescription medicines only as told by your health care provider.   If you were prescribed an antibiotic medicine, take or apply your antibiotic as told by your health care provider. Do not stop using the  antibiotic even if your condition starts to improve. General Instructions  Keep all follow-up visits as told by your health care provider. This is important.  Avoid the substance that caused your reaction. If you do not know what caused it, keep a journal to try to track what caused it. Write down:  What you eat.  What cosmetic products you use.  What you drink.  What  you wear in the affected area. This includes jewelry.  If you were given a dressing, take care of it as told by your health care provider. This includes when to change and remove it. SEEK MEDICAL CARE IF:   Your condition does not improve with treatment.  Your condition gets worse.  You have signs of infection such as swelling, tenderness, redness, soreness, or warmth in the affected area.  You have a fever.  You have new symptoms. SEEK IMMEDIATE MEDICAL CARE IF:   You have a severe headache, neck pain, or neck stiffness.  You vomit.  You feel very sleepy.  You notice red streaks coming from the affected area.  Your bone or joint underneath the affected area becomes painful after the skin has healed.  The affected area turns darker.  You have difficulty breathing.   This information is not intended to replace advice given to you by your health care provider. Make sure you discuss any questions you have with your health care provider.   Document Released: 07/13/2000 Document Revised: 04/06/2015 Document Reviewed: 12/01/2014 Elsevier Interactive Patient Education 2016 [place].  Back Exercises If you have pain in your back, do these exercises 2-3 times each day or as told by your doctor. When the pain goes away, do the exercises once each day, but repeat the steps more times for each exercise (do more repetitions). If you do not have pain in your back, do these exercises once each day or as told by your doctor. EXERCISES Single Knee to Chest Do these steps 3-5 times in a row for each leg:  Lie on your back on a firm bed or the floor with your legs stretched out.  Bring one knee to your chest.  Hold your knee to your chest by grabbing your knee or thigh.  Pull on your knee until you feel a gentle stretch in your lower back.  Keep doing the stretch for 10-30 seconds.  Slowly let go of your leg and straighten it. Pelvic Tilt Do these steps 5-10 times in a  row:  Lie on your back on a firm bed or the floor with your legs stretched out.  Bend your knees so they point up to the ceiling. Your feet should be flat on the floor.  Tighten your lower belly (abdomen) muscles to press your lower back against the floor. This will make your tailbone point up to the ceiling instead of pointing down to your feet or the floor.  Stay in this position for 5-10 seconds while you gently tighten your muscles and breathe evenly. Cat-Cow Do these steps until your lower back bends more easily:  Get on your hands and knees on a firm surface. Keep your hands under your shoulders, and keep your knees under your hips. You may put padding under your knees.  Let your head hang down, and make your tailbone point down to the floor so your lower back is round like the back of a cat.  Stay in this position for 5 seconds.  Slowly lift your head and make your tailbone  point up to the ceiling so your back hangs low (sags) like the back of a cow.  Stay in this position for 5 seconds. Press-Ups Do these steps 5-10 times in a row:  Lie on your belly (face-down) on the floor.  Place your hands near your head, about shoulder-width apart.  While you keep your back relaxed and keep your hips on the floor, slowly straighten your arms to raise the top half of your body and lift your shoulders. Do not use your back muscles. To make yourself more comfortable, you may change where you place your hands.  Stay in this position for 5 seconds.  Slowly return to lying flat on the floor. Bridges Do these steps 10 times in a row:  Lie on your back on a firm surface.  Bend your knees so they point up to the ceiling. Your feet should be flat on the floor.  Tighten your butt muscles and lift your butt off of the floor until your waist is almost as high as your knees. If you do not feel the muscles working in your butt and the back of your thighs, slide your feet 1-2 inches farther away  from your butt.  Stay in this position for 3-5 seconds.  Slowly lower your butt to the floor, and let your butt muscles relax. If this exercise is too easy, try doing it with your arms crossed over your chest. Belly Crunches Do these steps 5-10 times in a row:  Lie on your back on a firm bed or the floor with your legs stretched out.  Bend your knees so they point up to the ceiling. Your feet should be flat on the floor.  Cross your arms over your chest.  Tip your chin a little bit toward your chest but do not bend your neck.  Tighten your belly muscles and slowly raise your chest just enough to lift your shoulder blades a tiny bit off of the floor.  Slowly lower your chest and your head to the floor. Back Lifts Do these steps 5-10 times in a row:  Lie on your belly (face-down) with your arms at your sides, and rest your forehead on the floor.  Tighten the muscles in your legs and your butt.  Slowly lift your chest off of the floor while you keep your hips on the floor. Keep the back of your head in line with the curve in your back. Look at the floor while you do this.  Stay in this position for 3-5 seconds.  Slowly lower your chest and your face to the floor. GET HELP IF:  Your back pain gets a lot worse when you do an exercise.  Your back pain does not lessen 2 hours after you exercise. If you have any of these problems, stop doing the exercises. Do not do them again unless your doctor says it is okay. GET HELP RIGHT AWAY IF:  You have sudden, very bad back pain. If this happens, stop doing the exercises. Do not do them again unless your doctor says it is okay.   This information is not intended to replace advice given to you by your health care provider. Make sure you discuss any questions you have with your health care provider.   Document Released: 08/18/2010 Document Revised: 04/06/2015 Document Reviewed: 09/09/2014 Elsevier Interactive Patient Education 2016  [employer]  Herniated Disk With Rehab Between each vertebrae of the spine exists a disk. These disks contain a jelly-like material that  helps cushion the spinal column. Occasionally, damage to the supportive ligaments of the vertebrae causes a disk to shift from its normal alignment and place pressure on surrounding structures, such as the spinal cord. This is called a herniated (ruptured) disk. SYMPTOMS   Pain in the back, that often affects one side.  Pain that gets worse with movement, sneezing, coughing, or straining.  Muscle spasms in the back.  Pain, numbness, or weakness affecting one arm or leg (depending on whether injury is in the neck or low back).  Muscle loss (if the condition has become chronic).  Loss of stool (bowel) or urine (bladder) function. CAUSES  Herniated disks result when a disk becomes weak. The disk eventually ruptures and places pressure on the spinal cord. Herniated disks may occur from sudden injury (acute trauma) such as heavy labor, or from ongoing (chronic) stress, such as obesity.  RISK INCREASES WITH:  Sports that involve downward or twisting pressure on the neck or spine (football, weightlifting, horseback riding competition, bowling, tennis, jogging, track, racquetball, gymnastics).  Poor strength and flexibility.  Failure to warm up properly before activity.  Family history of low back pain or disk disorders.  Previous back surgery (especially fusion).  Preexisting forward displacement of a vertebra (spondylolisthesis).  Poor technique when lifting.  Prolonged sitting, especially with poor posture. PREVENTION  Learn and use proper technique when sitting or lifting.  Warm up and stretch properly before activity.  Maintain physical fitness:  Strength, flexibility, and endurance.  Cardiovascular fitness.  Maintain a healthy body weight.  If previously injured, avoid any intense physical activity that requires twisting of the body  under uncontrollable conditions. PROGNOSIS  If treated properly, herniated disks are usually curable within 6 weeks. Sometimes, surgery is required.  RELATED COMPLICATIONS   Permanent numbness, weakness, or paralysis and muscle loss.  Chronic back pain.  Loss of bowel or bladder function.  Decreased sexual function.  Risks of surgery: infection, bleeding, injury to nerves (persistent or increased numbness, weakness, or paralysis), persistent back pain, and spinal headache. TREATMENT Treatment first involves resting from any aggravating activities and the use of ice and medicine to reduce pain and inflammation. As muscle spasms begin to decrease, it is important to perform strengthening and stretching exercises of the back muscles. These will help teach and reinforce proper body posture. These exercises may be performed at home, or with a therapist. A therapist may complete a further evaluation and recommend additional treatments, such as ultrasound, traction (for herniated disks of the neck), a cervical collar (for herniated disks of the neck), or a corset or back brace (for herniated disks of the low back). Prolonged rest may do more harm than good. Your therapist will teach you proper techniques for performing simple activities, such as lifting an object off the floor or using proper posture while sitting. At night, it is advised that you sleep on your back, on a firm mattress, and place a pillow under your knees. Your caregiver may recommend oral steroids or an injection of corticosteroids in the space around the spinal cord (epidural space) in order to reduce pain and inflammation. For severe cases, surgery is recommended. MEDICATION   If pain medicine is needed, nonsteroidal anti-inflammatory medicines (aspirin and ibuprofen), or other minor pain relievers (acetaminophen), are often advised.  Do not take pain medicine for 7 days before surgery.  Prescription pain relievers may be given if  your caregiver thinks they are needed. Use only as directed and only as much as  you need.  Ointments applied to the skin may be helpful.  Corticosteroid injections may be given. These injections should be reserved for the most serious cases, as they can only be given a certain number of times.  Oral steroids may be given to reduce inflammation, although not usually for severe (acute) injuries. HEAT AND COLD  Cold treatment (icing) relieves pain and reduces inflammation. Cold treatment should be applied for 10 to 15 minutes every 2 to 3 hours, and immediately after activity that aggravates your symptoms. Use ice packs or an ice massage.  Heat treatment may be used before performing stretching and strengthening activities prescribed by your caregiver, physical therapist, or athletic trainer. Use a heat pack or a warm water soak. SEEK MEDICAL CARE IF:   Symptoms get worse or do not improve in 2 to 4 weeks, despite treatment.  You develop loss of bowel or bladder function.  New, unexplained symptoms develop. (Drugs used in treatment may produce side effects.) EXERCISES  RANGE OF MOTION (ROM) AND STRETCHING EXERCISES - Herniated Disk (Ruptured Disk) Most people with low back pain will find that their symptoms get worse with excessive bending forward (flexion) or arching at the low back (extension). The exercises that will help resolve your symptoms will focus on the opposite motion. Your physician, physical therapist or athletic trainer will help you determine which exercises will be most helpful to resolve your low back pain. Do not complete any exercises without first consulting with your caregiver. Discontinue any exercises that make your symptoms worse, until you speak to your caregiver. If you have pain, numbness or tingling that travels down into your buttocks, leg or foot, the goal of this therapy is for these symptoms to move closer to your back and to eventually go away. Sometimes, these leg  symptoms will get better, but your low back pain may get worse. This is typically an indication of progress in your rehabilitation. Be sure to be very alert to any changes in your symptoms and to the activities you have done in the 24 hours prior to the change. Sharing this information with your caregiver will allow him or her to best treat your condition. These exercises may help you when beginning to rehabilitate your injury. Your symptoms may go away with or without further involvement from your physician, physical therapist or athletic trainer. While completing these exercises, remember:   Restoring tissue flexibility helps normal motion to return to the joints. This allows healthier, less painful movement and activity.  An effective stretch should be held for at least 30 seconds.  A stretch should never be painful. You should only feel a gentle lengthening or release in the stretched tissue. FLEXION RANGE OF MOTION AND STRETCHING EXERCISES: STRETCH - Flexion, Single Knee to Chest  Lie on a firm bed or floor, with both legs extended in front of you.  Keeping one leg in contact with the floor, bring your opposite knee to your chest. Hold your leg in place by either grabbing behind your thigh or at your knee.  Pull until you feel a gentle stretch in your low back. Hold for __________ seconds.  Slowly release your grasp and repeat the exercise with the opposite side. Repeat __________ times. Complete this exercise __________ times per day.  STRETCH - Flexion, Double Knee to Chest   Lie on a firm bed or floor, with both legs extended in front of you.  Keeping one leg in contact with the floor, bring your opposite knee to  your chest.  Tense your stomach muscles to support your back and then lift your other knee to your chest. Hold your legs in place by either grabbing behind your thighs or at your knees.  Pull both knees toward your chest until you feel a gentle stretch in your low back.  Hold for __________ seconds.  Tense your stomach muscles and slowly return one leg at a time to the floor. Repeat __________ times. Complete this exercise __________ times per day.  STRETCH - Low Trunk Rotation  Lie on a firm bed or floor. Keeping your legs in front of you, bend your knees so they are both pointed toward the ceiling and your feet are flat on the floor.  Extend your arms out to the side. This will stabilize your upper body by keeping your shoulders in contact with the floor.  Gently and slowly drop both knees together to one side, until you feel a gentle stretch in your low back. Hold for __________ seconds.  Tense your stomach muscles to support your low back as you bring your knees back to the starting position. Repeat the exercise while dropping both knees to the other side. Repeat __________ times. Complete this exercise __________ times per day  EXTENSION RANGE OF MOTION AND FLEXIBILITY EXERCISES: STRETCH - Extension, Prone on Elbows   Lie on your stomach on the floor. (A bed will be too soft.) Place your palms about shoulder width apart.  Place your elbows under your shoulders. If this is too painful, stack pillows under your chest.  Allow your body to relax so that your hips drop lower and make contact more completely with the floor.  Hold this position for __________ seconds.  Slowly return to lying flat on the floor. Repeat __________ times. Complete this exercise __________ times per day.  RANGE OF MOTION - Extension, Prone Press Ups   Lie on your stomach on the floor. (A bed will be too soft.) Place your palms about shoulder width apart and at the height of your head.  Keeping your back as relaxed as possible, slowly straighten your elbows while keeping your hips on the floor. You may adjust the placement of your hands to maximize your comfort. As you gain motion, your hands will come more underneath your shoulders.  Hold this position for __________  seconds.  Slowly return to lying flat on the floor. Repeat __________ times. Complete this exercise __________ times per day.  RANGE OF MOTION- Quadruped, Neutral Spine   Assume a hands and knees position on a firm surface. Keep your hands under your shoulders and your knees under your hips. You may place padding under your knees for comfort.  Drop your head and point your tail bone toward the ground below you. This will round out your low back like an angry cat. Hold this position for __________ seconds.  Slowly lift your head and release your tail bone so that your back sags into a large arch, like an old horse.  Hold this position for __________ seconds.  Repeat this until you feel limber in your low back.  Now, find your "sweet spot." This will be the most comfortable position somewhere between the two previous positions. This is your neutral spine. Once you have found this position, tense your stomach muscles to support your low back.  Hold this position for __________ seconds. Repeat __________ times. Complete this exercise __________ times per day.  STRENGTHENING EXERCISES - Herniated Disk (Ruptured Disk) These exercises may help you  when beginning to rehabilitate your injury. These exercises should be done near your "sweet spot." This is the neutral, low-back arch, somewhere between fully rounded and fully arched, that is your least painful position. When performed in this safe range of motion, these exercises can be used for people who have either a flexion or extension based injury. These exercises may resolve your symptoms with or without further involvement from your physician, physical therapist or athletic trainer. While completing these exercises, remember:   Muscles can gain both the endurance and the strength needed for everyday activities through controlled exercises.  Complete these exercises as instructed by your physician, physical therapist or athletic trainer. Increase  the resistance and repetitions only as guided.  You may experience muscle soreness or fatigue, but the pain or discomfort you are trying to eliminate should never worsen during these exercises. If this pain does get worse, stop and make sure you are following the directions exactly. If the pain is still present after adjustments, discontinue the exercise until you can discuss the trouble with your clinician. STRENGTHENING - Deep Abdominals, Pelvic Tilt   Lie on a firm bed or floor. Keeping your legs in front of you, bend your knees so they are both pointed toward the ceiling and your feet are flat on the floor.  Tense your lower abdominal muscles to press your low back into the floor. This motion will rotate your pelvis so that your tail bone is scooping upwards rather than pointing at your feet or into the floor.  With a gentle tension and even breathing, hold this position for __________ seconds. Repeat __________ times. Complete this exercise __________ times per day.  STRENGTHENING - Abdominals, Crunches   Lie on a firm bed or floor. Keeping your legs in front of you, bend your knees so they are both pointed toward the ceiling and your feet are flat on the floor. Cross your arms over your chest.  Slightly tip your chin down without bending your neck.  Tense your abdominals and slowly lift your trunk high enough so that your shoulder blades are just off the floor. Lifting higher can put too much stress on the low back and does not further strengthen your abdominal muscles.  With control, return to the starting position. Repeat __________ times. Complete this exercise __________ times per day.  STRENGTHENING - Quadruped, Opposite UE/LE Lift  Assume a hands and knees position on a firm surface. Keep your hands under your shoulders and your knees under your hips. You may place padding under your knees for comfort.  Find your neutral spine and gently tense your abdominal muscles so that you  can maintain this position. Your shoulders and hips should form a rectangle that is parallel with the floor and is not twisted.  Keeping your trunk steady, lift your right hand no higher than your shoulder. Then lift your left leg no higher than your hip. Make sure you are not holding your breath. Hold this position for __________ seconds.  Continuing to keep your abdominal muscles tense and your back steady, slowly return to your starting position. Repeat with the opposite arm and leg. Repeat __________ times. Complete this exercise __________ times per day.  STRENGTHENING - Lower Abdominals, Double Knee Lift  Lie on a firm bed or floor. Keeping your legs in front of you, bend your knees so they are both pointed toward the ceiling and your feet are flat on the floor.  Tense your abdominal muscles to brace your low  back and slowly lift both of your knees until they come over your hips. Be certain not to hold your breath.  Hold for __________ seconds. Using your abdominal muscles, return to the starting position in a slow and controlled manner. Repeat __________ times. Complete this exercise __________ times per day.  POSTURE AND BODY MECHANICS CONSIDERATIONS - Herniated Disc (Ruptured Disk) Keeping correct posture when sitting, standing or completing your activities will reduce the stress put on different body tissues, allowing injured tissues a chance to heal and limiting painful experiences. The following are general guidelines for improved posture. Your physician or physical therapist will provide you with any instructions specific to your needs. While reading these guidelines, remember:  The exercises prescribed by your provider will help you build the flexibility and strength to maintain correct postures.  The correct posture provides the best environment for your joints to work. All of your joints have less wear and tear when properly supported by a spine with good posture. This means you  will experience a healthier, less painful body.  Correct posture must be practiced with all of your activities, especially prolonged sitting and standing. Correct posture is as important when doing repetitive low-stress activities (typing) as it is when doing a single heavy-load activity (lifting). RESTING POSITIONS Consider which positions are most painful for you when choosing a resting position. If you have pain with flexion-based activities (sitting, bending, stooping, squatting), choose a position that allows you to rest in a less flexed posture. You would want to avoid curling into a fetal position on your side. If your pain gets worse with extension-based activities (prolonged standing, working overhead), avoid resting in an extended position such as sleeping on your stomach. Most people will find more comfort when they rest with their spine in a more neutral position, neither too rounded nor too arched. Lying on a non-sagging bed on your side, with a pillow between your knees, or on your back with a pillow under your knees will often provide some relief. Keep in mind, being in any one position for a prolonged period of time, no matter how correct your posture, can still lead to stiffness. PROPER SITTING POSTURE In order to minimize stress and discomfort on your spine, you must sit with correct posture. Sitting with good posture should be effortless for a healthy body. Returning to good posture is a gradual process. Many people can work toward this most comfortably by using various supports until they have the flexibility and strength to maintain this posture on their own. When sitting with proper posture, your ears will fall over your shoulders and your shoulders will fall over your hips. You should use the back of the chair to support your upper back. Your low back will be in a neutral position, just slightly arched. You may place a small pillow or folded towel at the base of your low back for  support.  When working at a desk, create an environment that supports good, upright posture. Without extra support, muscles tire, which leads to excessive strain on joints and other tissues. Keep these recommendations in mind. CHAIR  A chair should be able to slide under your desk when your back makes contact with the back of the chair. This allows you to work closely.  The chair's height should allow your eyes to be level with the upper part of your monitor and your hands to be slightly lower than your elbows. BODY POSITION  Your feet should make contact with the  floor. If this is not possible, use a foot rest.  Keep your ears over your shoulders. This will reduce stress on your neck and low back. INCORRECT SITTING POSTURES  If you are feeling tired and unable to assume a healthy sitting posture, do not slouch or slump. This puts excessive strain on your back tissues, causing more damage and pain. Healthier options include:  Using more support, like a lumbar pillow.  Switching tasks, to something that requires you to be upright or walking.  Talking a brief walk.  Lying down to rest in a neutral-spine position. PROLONGED STANDING WHILE SLIGHTLY LEANING FORWARD  When completing a task that requires you to lean forward while standing in one place for a long time, place either foot up on a stationary 2-4 inch high object, to help maintain the best posture. When both feet are on the ground, the low back tends to lose its slight inward curve. If this curve flattens (or becomes too large), the back and your other joints will experience too much stress, tire more quickly and can cause pain. CORRECT STANDING POSTURES Proper standing posture should be assumed with all daily activities, even if they only take a few moments, like when brushing your teeth. As in sitting, your ears should fall over your shoulders and your shoulders should fall over your hips. You should keep a slight tension in your  abdominal muscles to brace your spine. Your tailbone should point down to the ground, not behind your body, resulting in an over-extended swayback posture.  INCORRECT STANDING POSTURES  Common incorrect standing postures include a forward head, locked knees or an excessive swayback. WALKING Walk with an upright posture. Your ears, shoulders and hips should all line-up. PROLONGED ACTIVITY IN A FLEXED POSITION When completing a task that requires you to bend forward at your waist or lean over a low surface, try finding a way to stabilize 3 out of 4 of your limbs. You can place a hand or elbow on your thigh, or rest a knee on the surface you are reaching across. This will provide you more stability so that your muscles do not tire as quickly. By keeping your knees relaxed, or slightly bent, you will also reduce stress across your low back. CORRECT LIFTING TECHNIQUES DO :   Assume a wide stance. This will provide you more stability and the opportunity to get as close as possible to the object you are lifting.  Tense your abdominals to brace your spine. Then, bend at the knees and hips. Keeping your back locked in a neutral-spine position, lift using your leg muscles. Lift with your legs, keeping your back straight.  Test the weight of unknown objects before attempting to lift them.  Try to keep your elbows down by your sides, in order get the best strength from your shoulders when carrying an object.  Always ask for help when lifting heavy or awkward objects. INCORRECT LIFTING TECHNIQUES DO NOT:   Lock your knees when lifting, even if it is a small object.  Bend and twist. Pivot at your feet or move your feet when needing to change directions.  Assume that you can safely pick up even a paper clip, without proper posture.   This information is not intended to replace advice given to you by your health care provider. Make sure you discuss any questions you have with your health care provider.    Follow up with orthopedic provider for re-evaluation of back pain. Fill prescriptions for prednisone,  flexeril and pain medication for back pain relief. Avoid hair dye usage and other irritants. Return to the ED if you experience difficulty breathing, difficulty swallowing, lip or tongue swelling, hives, bowel/bladder incontinence, numbness/tingling in both extremities, burning with urination, fever.

## 2015-08-15 NOTE — ED Notes (Signed)
Had beard trimmed and colored on Saturday, now has blisters throughout beard area. Took Benadryl 50mg  at 10am.
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
## 2017-02-23 ENCOUNTER — Encounter (HOSPITAL_COMMUNITY): Payer: Self-pay | Admitting: Emergency Medicine

## 2017-02-23 ENCOUNTER — Emergency Department (HOSPITAL_COMMUNITY): Payer: No Typology Code available for payment source

## 2017-02-23 ENCOUNTER — Emergency Department (HOSPITAL_COMMUNITY)
Admission: EM | Admit: 2017-02-23 | Discharge: 2017-02-23 | Disposition: A | Discharge status: Home / Self Care | Payer: No Typology Code available for payment source | Attending: Emergency Medicine | Admitting: Emergency Medicine

## 2017-02-23 DIAGNOSIS — Y999 Unspecified external cause status: Secondary | ICD-10-CM | POA: Diagnosis not present

## 2017-02-23 DIAGNOSIS — R109 Unspecified abdominal pain: Secondary | ICD-10-CM | POA: Insufficient documentation

## 2017-02-23 DIAGNOSIS — R0789 Other chest pain: Secondary | ICD-10-CM | POA: Diagnosis not present

## 2017-02-23 DIAGNOSIS — Y92410 Unspecified street and highway as the place of occurrence of the external cause: Secondary | ICD-10-CM | POA: Insufficient documentation

## 2017-02-23 DIAGNOSIS — S79912A Unspecified injury of left hip, initial encounter: Secondary | ICD-10-CM | POA: Diagnosis not present

## 2017-02-23 DIAGNOSIS — Y9389 Activity, other specified: Secondary | ICD-10-CM | POA: Insufficient documentation

## 2017-02-23 DIAGNOSIS — S29009A Unspecified injury of muscle and tendon of unspecified wall of thorax, initial encounter: Secondary | ICD-10-CM | POA: Diagnosis not present

## 2017-02-23 DIAGNOSIS — M25552 Pain in left hip: Secondary | ICD-10-CM | POA: Diagnosis not present

## 2017-02-23 DIAGNOSIS — S3991XA Unspecified injury of abdomen, initial encounter: Secondary | ICD-10-CM | POA: Diagnosis not present

## 2017-02-23 DIAGNOSIS — S3993XA Unspecified injury of pelvis, initial encounter: Secondary | ICD-10-CM | POA: Diagnosis not present

## 2017-02-23 LAB — URINALYSIS, ROUTINE W REFLEX MICROSCOPIC
Bilirubin Urine: NEGATIVE
GLUCOSE, UA: NEGATIVE mg/dL
Hgb urine dipstick: NEGATIVE
Ketones, ur: NEGATIVE mg/dL
LEUKOCYTES UA: NEGATIVE
Nitrite: NEGATIVE
PH: 5 (ref 5.0–8.0)
PROTEIN: NEGATIVE mg/dL
Specific Gravity, Urine: 1.021 (ref 1.005–1.030)

## 2017-02-23 LAB — BASIC METABOLIC PANEL
ANION GAP: 7 (ref 5–15)
BUN: 18 mg/dL (ref 6–20)
CALCIUM: 9 mg/dL (ref 8.9–10.3)
CO2: 26 mmol/L (ref 22–32)
Chloride: 106 mmol/L (ref 101–111)
Creatinine, Ser: 1.37 mg/dL — ABNORMAL HIGH (ref 0.61–1.24)
GFR calc non Af Amer: 59 mL/min — ABNORMAL LOW (ref >60)
Glucose, Bld: 133 mg/dL — ABNORMAL HIGH (ref 65–99)
POTASSIUM: 3.8 mmol/L (ref 3.5–5.1)
Sodium: 139 mmol/L (ref 135–145)

## 2017-02-23 LAB — CBC
HEMATOCRIT: 44.4 % (ref 39.0–52.0)
Hemoglobin: 14.2 g/dL (ref 13.0–17.0)
MCH: 25.6 pg — AB (ref 26.0–34.0)
MCHC: 32 g/dL (ref 30.0–36.0)
MCV: 80.1 fL (ref 78.0–100.0)
PLATELETS: 251 10*3/uL (ref 150–400)
RBC: 5.54 MIL/uL (ref 4.22–5.81)
RDW: 13.4 % (ref 11.5–15.5)
WBC: 7.4 10*3/uL (ref 4.0–10.5)

## 2017-02-23 MED ORDER — MORPHINE SULFATE (PF) 4 MG/ML IV SOLN
4.0000 mg | Freq: Once | INTRAVENOUS | Status: AC
  Administered 2017-02-23: 4 mg via INTRAVENOUS
  Filled 2017-02-23: qty 1

## 2017-02-23 MED ORDER — IOPAMIDOL (ISOVUE-300) INJECTION 61%
INTRAVENOUS | Status: AC
  Filled 2017-02-23: qty 100
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
## 2017-02-23 NOTE — ED Notes (Signed)
Patient transported to CT 

## 2017-02-23 NOTE — ED Notes (Signed)
Patient transported to X-ray 
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
## 2017-02-28 ENCOUNTER — Ambulatory Visit (INDEPENDENT_AMBULATORY_CARE_PROVIDER_SITE_OTHER): Payer: BLUE CROSS/BLUE SHIELD | Admitting: Physician Assistant

## 2017-02-28 ENCOUNTER — Ambulatory Visit (INDEPENDENT_AMBULATORY_CARE_PROVIDER_SITE_OTHER): Payer: BLUE CROSS/BLUE SHIELD

## 2017-02-28 ENCOUNTER — Encounter: Payer: Self-pay | Admitting: Physician Assistant

## 2017-02-28 DIAGNOSIS — M542 Cervicalgia: Secondary | ICD-10-CM

## 2017-02-28 DIAGNOSIS — M545 Low back pain: Secondary | ICD-10-CM

## 2017-02-28 DIAGNOSIS — M25561 Pain in right knee: Secondary | ICD-10-CM | POA: Diagnosis not present

## 2017-02-28 DIAGNOSIS — S301XXD Contusion of abdominal wall, subsequent encounter: Secondary | ICD-10-CM

## 2017-02-28 LAB — POCT CBC
Granulocyte percent: 60.5 %G (ref 37–80)
HCT, POC: 45.3 % (ref 43.5–53.7)
HEMOGLOBIN: 15.1 g/dL (ref 14.1–18.1)
LYMPH, POC: 2.7 (ref 0.6–3.4)
MCH, POC: 26 pg — AB (ref 27–31.2)
MCHC: 33.3 g/dL (ref 31.8–35.4)
MCV: 78.1 fL — AB (ref 80–97)
MID (cbc): 0.2 (ref 0–0.9)
MPV: 7.3 fL (ref 0–99.8)
PLATELET COUNT, POC: 228 10*3/uL (ref 142–424)
POC Granulocyte: 4.5 (ref 2–6.9)
POC LYMPH PERCENT: 36.6 %L (ref 10–50)
POC MID %: 2.9 %M (ref 0–12)
RBC: 5.8 M/uL (ref 4.69–6.13)
RDW, POC: 13.8 %
WBC: 7.4 10*3/uL (ref 4.6–10.2)

## 2017-02-28 MED ORDER — TRAMADOL HCL 50 MG PO TABS: 50.0000 mg | ORAL_TABLET | Freq: Three times a day (TID) | ORAL | 0 refills | Status: DC | PRN

## 2017-02-28 MED ORDER — CYCLOBENZAPRINE HCL 10 MG PO TABS: 5.0000 mg | ORAL_TABLET | Freq: Three times a day (TID) | ORAL | 0 refills | Status: DC | PRN

## 2017-02-28 MED ORDER — KETOROLAC TROMETHAMINE 60 MG/2ML IM SOLN
60.0000 mg | Freq: Once | INTRAMUSCULAR | Status: AC
  Administered 2017-02-28: 60 mg via INTRAMUSCULAR

## 2017-02-28 MED ORDER — NAPROXEN 500 MG PO TABS: 500.0000 mg | ORAL_TABLET | Freq: Two times a day (BID) | ORAL | 0 refills | Status: DC
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
## 2017-02-28 NOTE — Patient Instructions (Addendum)
o    IF you received an x-ray today, you will receive an invoice from [facility]. Please contact [facility] at [phone] with questions or concerns regarding your invoice.   IF you received labwork today, you will receive an invoice from [place]. Please contact [facility] at [phone] with questions or concerns regarding your invoice.   Our billing staff will not be able to assist you with questions regarding bills from these companies.  You will be contacted with the lab results as soon as they are available. The fastest way to get your results is to activate your My Chart account. Instructions are located on the last page of this paperwork. If you have not heard from [place] regarding the results in 2 weeks, please contact this office.

## 2017-03-01 LAB — CMP14+EGFR
ALK PHOS: 98 IU/L (ref 39–117)
ALT: 18 IU/L (ref 0–44)
AST: 17 IU/L (ref 0–40)
Albumin/Globulin Ratio: 1.5 (ref 1.2–2.2)
Albumin: 4 g/dL (ref 3.5–5.5)
BUN/Creatinine Ratio: 12 (ref 9–20)
BUN: 13 mg/dL (ref 6–24)
Bilirubin Total: 0.3 mg/dL (ref 0.0–1.2)
CALCIUM: 9.5 mg/dL (ref 8.7–10.2)
CO2: 25 mmol/L (ref 20–29)
CREATININE: 1.13 mg/dL (ref 0.76–1.27)
Chloride: 101 mmol/L (ref 96–106)
GFR calc Af Amer: 88 mL/min/{1.73_m2} (ref >59)
GFR calc non Af Amer: 76 mL/min/{1.73_m2} (ref >59)
GLUCOSE: 108 mg/dL — AB (ref 65–99)
Globulin, Total: 2.7 g/dL (ref 1.5–4.5)
Potassium: 4.4 mmol/L (ref 3.5–5.2)
SODIUM: 141 mmol/L (ref 134–144)
Total Protein: 6.7 g/dL (ref 6.0–8.5)

## 2017-03-05 ENCOUNTER — Emergency Department (HOSPITAL_COMMUNITY): Payer: BLUE CROSS/BLUE SHIELD

## 2017-03-05 ENCOUNTER — Emergency Department (HOSPITAL_COMMUNITY)
Admission: EM | Admit: 2017-03-05 | Discharge: 2017-03-05 | Disposition: A | Discharge status: Home / Self Care | Payer: BLUE CROSS/BLUE SHIELD | Attending: Physician Assistant | Admitting: Physician Assistant

## 2017-03-05 ENCOUNTER — Ambulatory Visit: Payer: BLUE CROSS/BLUE SHIELD | Admitting: Physician Assistant

## 2017-03-05 DIAGNOSIS — M48061 Spinal stenosis, lumbar region without neurogenic claudication: Secondary | ICD-10-CM | POA: Diagnosis not present

## 2017-03-05 DIAGNOSIS — R350 Frequency of micturition: Secondary | ICD-10-CM | POA: Insufficient documentation

## 2017-03-05 DIAGNOSIS — R110 Nausea: Secondary | ICD-10-CM | POA: Diagnosis not present

## 2017-03-05 DIAGNOSIS — R32 Unspecified urinary incontinence: Secondary | ICD-10-CM | POA: Insufficient documentation

## 2017-03-05 DIAGNOSIS — R079 Chest pain, unspecified: Secondary | ICD-10-CM | POA: Diagnosis not present

## 2017-03-05 LAB — URINALYSIS, ROUTINE W REFLEX MICROSCOPIC
BILIRUBIN URINE: NEGATIVE
GLUCOSE, UA: NEGATIVE mg/dL
HGB URINE DIPSTICK: NEGATIVE
KETONES UR: NEGATIVE mg/dL
LEUKOCYTES UA: NEGATIVE
NITRITE: NEGATIVE
PH: 6 (ref 5.0–8.0)
PROTEIN: 30 mg/dL — AB
SQUAMOUS EPITHELIAL / LPF: NONE SEEN
Specific Gravity, Urine: 1.009 (ref 1.005–1.030)

## 2017-03-05 LAB — BASIC METABOLIC PANEL
ANION GAP: 12 (ref 5–15)
BUN: 17 mg/dL (ref 6–20)
CO2: 20 mmol/L — ABNORMAL LOW (ref 22–32)
Calcium: 9.4 mg/dL (ref 8.9–10.3)
Chloride: 105 mmol/L (ref 101–111)
Creatinine, Ser: 1.52 mg/dL — ABNORMAL HIGH (ref 0.61–1.24)
GFR, EST NON AFRICAN AMERICAN: 52 mL/min — AB (ref >60)
GLUCOSE: 152 mg/dL — AB (ref 65–99)
POTASSIUM: 4.7 mmol/L (ref 3.5–5.1)
Sodium: 137 mmol/L (ref 135–145)

## 2017-03-05 LAB — I-STAT TROPONIN, ED: TROPONIN I, POC: 0.05 ng/mL (ref 0.00–0.08)

## 2017-03-05 LAB — CBC
HEMATOCRIT: 46.8 % (ref 39.0–52.0)
HEMOGLOBIN: 15.1 g/dL (ref 13.0–17.0)
MCH: 25.9 pg — ABNORMAL LOW (ref 26.0–34.0)
MCHC: 32.3 g/dL (ref 30.0–36.0)
MCV: 80.4 fL (ref 78.0–100.0)
Platelets: 217 10*3/uL (ref 150–400)
RBC: 5.82 MIL/uL — AB (ref 4.22–5.81)
RDW: 13.8 % (ref 11.5–15.5)
WBC: 8.5 10*3/uL (ref 4.0–10.5)

## 2017-03-05 MED ORDER — ONDANSETRON HCL 4 MG/2ML IJ SOLN
4.0000 mg | Freq: Once | INTRAMUSCULAR | Status: AC
  Administered 2017-03-05: 4 mg via INTRAVENOUS
  Filled 2017-03-05: qty 2

## 2017-03-05 MED ORDER — SODIUM CHLORIDE 0.9 % IV BOLUS (SEPSIS)
1000.0000 mL | Freq: Once | INTRAVENOUS | Status: AC
  Administered 2017-03-05: 1000 mL via INTRAVENOUS

## 2017-03-05 MED ORDER — MORPHINE SULFATE (PF) 4 MG/ML IV SOLN
4.0000 mg | Freq: Once | INTRAVENOUS | Status: AC
  Administered 2017-03-05: 4 mg via INTRAVENOUS
  Filled 2017-03-05: qty 1

## 2017-03-05 MED ORDER — LORAZEPAM 2 MG/ML IJ SOLN
INTRAMUSCULAR | Status: AC
  Filled 2017-03-05: qty 1
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
## 2017-03-05 NOTE — ED Notes (Signed)
Pt and family understood dc material. NAD Noted 

## 2017-03-05 NOTE — Discharge Instructions (Signed)
Tonight your MRI shows no change in your previously identified Lumbar discs disease You have been give referral to Urology and Neurosurgery fro follow up if you continue to have symptoms
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
## 2017-03-07 ENCOUNTER — Ambulatory Visit (INDEPENDENT_AMBULATORY_CARE_PROVIDER_SITE_OTHER): Payer: BLUE CROSS/BLUE SHIELD | Admitting: Physician Assistant

## 2017-03-07 ENCOUNTER — Encounter: Payer: Self-pay | Admitting: Physician Assistant

## 2017-03-07 VITALS — BP 132/89 | HR 85 | Temp 98.1°F | Resp 16 | Ht 69.5 in | Wt 232.2 lb

## 2017-03-07 DIAGNOSIS — R3915 Urgency of urination: Secondary | ICD-10-CM | POA: Diagnosis not present

## 2017-03-07 DIAGNOSIS — E119 Type 2 diabetes mellitus without complications: Secondary | ICD-10-CM

## 2017-03-07 LAB — POCT URINALYSIS DIP (MANUAL ENTRY)
Bilirubin, UA: NEGATIVE
GLUCOSE UA: NEGATIVE mg/dL
Ketones, POC UA: NEGATIVE mg/dL
Leukocytes, UA: NEGATIVE
NITRITE UA: NEGATIVE
PH UA: 6.5 (ref 5.0–8.0)
PROTEIN UA: NEGATIVE mg/dL
Spec Grav, UA: 1.01 (ref 1.010–1.025)
UROBILINOGEN UA: 1 U/dL

## 2017-03-07 LAB — POCT GLYCOSYLATED HEMOGLOBIN (HGB A1C): HEMOGLOBIN A1C: 7.3
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
## 2017-03-07 NOTE — Patient Instructions (Signed)
     IF you received an x-ray today, you will receive an invoice from [facility]. Please contact [facility] at [phone] with questions or concerns regarding your invoice.   IF you received labwork today, you will receive an invoice from [facility]. Please contact [facility] at [phone] with questions or concerns regarding your invoice.   Our billing staff will not be able to assist you with questions regarding bills from these companies.  You will be contacted with the lab results as soon as they are available. The fastest way to get your results is to activate your My Chart account. Instructions are located on the last page of this paperwork. If you have not heard from us regarding the results in 2 weeks, please contact this office.     

## 2017-03-08 LAB — PSA: Prostate Specific Ag, Serum: 1.4 ng/mL (ref 0.0–4.0)

## 2017-03-13 ENCOUNTER — Telehealth: Payer: Self-pay | Admitting: Physician Assistant
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
## 2017-03-14 NOTE — Telephone Encounter (Signed)
Paperwork scanned and faxed to employer on 03/14/17

## 2017-03-15 ENCOUNTER — Encounter: Payer: Self-pay | Admitting: Physician Assistant

## 2017-03-15 ENCOUNTER — Ambulatory Visit (INDEPENDENT_AMBULATORY_CARE_PROVIDER_SITE_OTHER): Payer: BLUE CROSS/BLUE SHIELD | Admitting: Physician Assistant

## 2017-03-15 VITALS — BP 118/75 | HR 79 | Temp 98.2°F | Resp 18 | Ht 69.5 in | Wt 232.0 lb

## 2017-03-15 DIAGNOSIS — E119 Type 2 diabetes mellitus without complications: Secondary | ICD-10-CM

## 2017-03-15 DIAGNOSIS — M545 Low back pain: Secondary | ICD-10-CM | POA: Diagnosis not present
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
## 2017-03-15 NOTE — Patient Instructions (Signed)
     IF you received an x-ray today, you will receive an invoice from [facility]. Please contact [facility] at [phone] with questions or concerns regarding your invoice.   IF you received labwork today, you will receive an invoice from [facility]. Please contact [facility] at [phone] with questions or concerns regarding your invoice.   Our billing staff will not be able to assist you with questions regarding bills from these companies.  You will be contacted with the lab results as soon as they are available. The fastest way to get your results is to activate your My Chart account. Instructions are located on the last page of this paperwork. If you have not heard from us regarding the results in 2 weeks, please contact this office.     

## 2017-03-16 LAB — MICROALBUMIN, URINE: Microalbumin, Urine: 3.1 ug/mL

## 2017-03-16 LAB — LIPID PANEL
CHOLESTEROL TOTAL: 151 mg/dL (ref 100–199)
Chol/HDL Ratio: 5 ratio (ref 0.0–5.0)
HDL: 30 mg/dL — ABNORMAL LOW (ref >39)
LDL CALC: 96 mg/dL (ref 0–99)
TRIGLYCERIDES: 125 mg/dL (ref 0–149)
VLDL Cholesterol Cal: 25 mg/dL (ref 5–40)

## 2017-03-25 DIAGNOSIS — Z0271 Encounter for disability determination: Secondary | ICD-10-CM

## 2017-04-02 ENCOUNTER — Other Ambulatory Visit: Payer: Self-pay | Admitting: Physician Assistant

## 2017-04-02 DIAGNOSIS — M542 Cervicalgia: Secondary | ICD-10-CM

## 2017-04-09 DIAGNOSIS — E119 Type 2 diabetes mellitus without complications: Secondary | ICD-10-CM | POA: Diagnosis not present

## 2017-04-09 LAB — HM DIABETES EYE EXAM

## 2017-04-11 DIAGNOSIS — M545 Low back pain: Secondary | ICD-10-CM | POA: Diagnosis not present

## 2017-04-16 DIAGNOSIS — M6281 Muscle weakness (generalized): Secondary | ICD-10-CM | POA: Diagnosis not present

## 2017-04-16 DIAGNOSIS — R293 Abnormal posture: Secondary | ICD-10-CM | POA: Diagnosis not present

## 2017-04-16 DIAGNOSIS — M545 Low back pain: Secondary | ICD-10-CM | POA: Diagnosis not present

## 2017-04-16 DIAGNOSIS — M2560 Stiffness of unspecified joint, not elsewhere classified: Secondary | ICD-10-CM | POA: Diagnosis not present

## 2017-04-18 DIAGNOSIS — M2560 Stiffness of unspecified joint, not elsewhere classified: Secondary | ICD-10-CM | POA: Diagnosis not present

## 2017-04-18 DIAGNOSIS — M6281 Muscle weakness (generalized): Secondary | ICD-10-CM | POA: Diagnosis not present

## 2017-04-18 DIAGNOSIS — R293 Abnormal posture: Secondary | ICD-10-CM | POA: Diagnosis not present

## 2017-04-18 DIAGNOSIS — M545 Low back pain: Secondary | ICD-10-CM | POA: Diagnosis not present

## 2017-04-22 DIAGNOSIS — R293 Abnormal posture: Secondary | ICD-10-CM | POA: Diagnosis not present

## 2017-04-22 DIAGNOSIS — M2560 Stiffness of unspecified joint, not elsewhere classified: Secondary | ICD-10-CM | POA: Diagnosis not present

## 2017-04-22 DIAGNOSIS — M6281 Muscle weakness (generalized): Secondary | ICD-10-CM | POA: Diagnosis not present

## 2017-04-22 DIAGNOSIS — M545 Low back pain: Secondary | ICD-10-CM | POA: Diagnosis not present

## 2017-04-26 DIAGNOSIS — M2560 Stiffness of unspecified joint, not elsewhere classified: Secondary | ICD-10-CM | POA: Diagnosis not present

## 2017-04-26 DIAGNOSIS — R293 Abnormal posture: Secondary | ICD-10-CM | POA: Diagnosis not present

## 2017-04-26 DIAGNOSIS — M545 Low back pain: Secondary | ICD-10-CM | POA: Diagnosis not present

## 2017-04-26 DIAGNOSIS — M6281 Muscle weakness (generalized): Secondary | ICD-10-CM | POA: Diagnosis not present

## 2017-05-07 DIAGNOSIS — R293 Abnormal posture: Secondary | ICD-10-CM | POA: Diagnosis not present

## 2017-05-07 DIAGNOSIS — M6281 Muscle weakness (generalized): Secondary | ICD-10-CM | POA: Diagnosis not present

## 2017-05-07 DIAGNOSIS — M545 Low back pain: Secondary | ICD-10-CM | POA: Diagnosis not present

## 2017-05-07 DIAGNOSIS — M2560 Stiffness of unspecified joint, not elsewhere classified: Secondary | ICD-10-CM | POA: Diagnosis not present

## 2017-05-10 DIAGNOSIS — R293 Abnormal posture: Secondary | ICD-10-CM | POA: Diagnosis not present

## 2017-05-10 DIAGNOSIS — M2560 Stiffness of unspecified joint, not elsewhere classified: Secondary | ICD-10-CM | POA: Diagnosis not present

## 2017-05-10 DIAGNOSIS — M6281 Muscle weakness (generalized): Secondary | ICD-10-CM | POA: Diagnosis not present

## 2017-05-10 DIAGNOSIS — M545 Low back pain: Secondary | ICD-10-CM | POA: Diagnosis not present

## 2017-05-14 DIAGNOSIS — M2560 Stiffness of unspecified joint, not elsewhere classified: Secondary | ICD-10-CM | POA: Diagnosis not present

## 2017-05-14 DIAGNOSIS — M545 Low back pain: Secondary | ICD-10-CM | POA: Diagnosis not present

## 2017-05-14 DIAGNOSIS — R293 Abnormal posture: Secondary | ICD-10-CM | POA: Diagnosis not present

## 2017-05-14 DIAGNOSIS — M6281 Muscle weakness (generalized): Secondary | ICD-10-CM | POA: Diagnosis not present

## 2017-05-24 DIAGNOSIS — M545 Low back pain: Secondary | ICD-10-CM | POA: Diagnosis not present

## 2017-05-24 DIAGNOSIS — M6281 Muscle weakness (generalized): Secondary | ICD-10-CM | POA: Diagnosis not present

## 2017-05-24 DIAGNOSIS — M2560 Stiffness of unspecified joint, not elsewhere classified: Secondary | ICD-10-CM | POA: Diagnosis not present

## 2017-05-24 DIAGNOSIS — R293 Abnormal posture: Secondary | ICD-10-CM | POA: Diagnosis not present

## 2017-06-13 DIAGNOSIS — M2560 Stiffness of unspecified joint, not elsewhere classified: Secondary | ICD-10-CM | POA: Diagnosis not present

## 2017-06-13 DIAGNOSIS — R293 Abnormal posture: Secondary | ICD-10-CM | POA: Diagnosis not present

## 2017-06-13 DIAGNOSIS — M6281 Muscle weakness (generalized): Secondary | ICD-10-CM | POA: Diagnosis not present

## 2017-06-13 DIAGNOSIS — M545 Low back pain: Secondary | ICD-10-CM | POA: Diagnosis not present

## 2017-06-17 ENCOUNTER — Ambulatory Visit: Payer: BLUE CROSS/BLUE SHIELD | Admitting: Physician Assistant

## 2017-06-18 DIAGNOSIS — M545 Low back pain: Secondary | ICD-10-CM | POA: Diagnosis not present

## 2017-06-18 DIAGNOSIS — M2560 Stiffness of unspecified joint, not elsewhere classified: Secondary | ICD-10-CM | POA: Diagnosis not present

## 2017-06-18 DIAGNOSIS — R293 Abnormal posture: Secondary | ICD-10-CM | POA: Diagnosis not present

## 2017-06-18 DIAGNOSIS — M6281 Muscle weakness (generalized): Secondary | ICD-10-CM | POA: Diagnosis not present

## 2017-06-27 DIAGNOSIS — M2560 Stiffness of unspecified joint, not elsewhere classified: Secondary | ICD-10-CM | POA: Diagnosis not present

## 2017-06-27 DIAGNOSIS — M545 Low back pain: Secondary | ICD-10-CM | POA: Diagnosis not present

## 2017-06-27 DIAGNOSIS — M6281 Muscle weakness (generalized): Secondary | ICD-10-CM | POA: Diagnosis not present

## 2017-06-27 DIAGNOSIS — R293 Abnormal posture: Secondary | ICD-10-CM | POA: Diagnosis not present

## 2017-07-04 DIAGNOSIS — M545 Low back pain: Secondary | ICD-10-CM | POA: Diagnosis not present

## 2017-07-04 DIAGNOSIS — M2560 Stiffness of unspecified joint, not elsewhere classified: Secondary | ICD-10-CM | POA: Diagnosis not present

## 2017-07-04 DIAGNOSIS — R293 Abnormal posture: Secondary | ICD-10-CM | POA: Diagnosis not present

## 2017-07-04 DIAGNOSIS — M6281 Muscle weakness (generalized): Secondary | ICD-10-CM | POA: Diagnosis not present

## 2017-07-09 DIAGNOSIS — M545 Low back pain: Secondary | ICD-10-CM | POA: Diagnosis not present

## 2017-07-09 DIAGNOSIS — R293 Abnormal posture: Secondary | ICD-10-CM | POA: Diagnosis not present

## 2017-07-09 DIAGNOSIS — M6281 Muscle weakness (generalized): Secondary | ICD-10-CM | POA: Diagnosis not present

## 2017-07-09 DIAGNOSIS — M2560 Stiffness of unspecified joint, not elsewhere classified: Secondary | ICD-10-CM | POA: Diagnosis not present

## 2017-07-11 DIAGNOSIS — R293 Abnormal posture: Secondary | ICD-10-CM | POA: Diagnosis not present

## 2017-07-11 DIAGNOSIS — M545 Low back pain: Secondary | ICD-10-CM | POA: Diagnosis not present

## 2017-07-11 DIAGNOSIS — M2560 Stiffness of unspecified joint, not elsewhere classified: Secondary | ICD-10-CM | POA: Diagnosis not present

## 2017-07-11 DIAGNOSIS — M6281 Muscle weakness (generalized): Secondary | ICD-10-CM | POA: Diagnosis not present

## 2018-04-15 ENCOUNTER — Ambulatory Visit (INDEPENDENT_AMBULATORY_CARE_PROVIDER_SITE_OTHER): Payer: BLUE CROSS/BLUE SHIELD | Admitting: Urgent Care

## 2018-04-15 ENCOUNTER — Other Ambulatory Visit: Payer: Self-pay

## 2018-04-15 ENCOUNTER — Encounter: Payer: Self-pay | Admitting: Urgent Care

## 2018-04-15 VITALS — BP 114/79 | HR 83 | Temp 98.3°F | Ht 69.0 in | Wt 232.0 lb

## 2018-04-15 DIAGNOSIS — Z1211 Encounter for screening for malignant neoplasm of colon: Secondary | ICD-10-CM | POA: Diagnosis not present

## 2018-04-15 DIAGNOSIS — E6609 Other obesity due to excess calories: Secondary | ICD-10-CM

## 2018-04-15 DIAGNOSIS — E041 Nontoxic single thyroid nodule: Secondary | ICD-10-CM

## 2018-04-15 DIAGNOSIS — Z0001 Encounter for general adult medical examination with abnormal findings: Secondary | ICD-10-CM

## 2018-04-15 DIAGNOSIS — Z6834 Body mass index (BMI) 34.0-34.9, adult: Secondary | ICD-10-CM | POA: Diagnosis not present

## 2018-04-15 DIAGNOSIS — E119 Type 2 diabetes mellitus without complications: Secondary | ICD-10-CM | POA: Diagnosis not present

## 2018-04-15 DIAGNOSIS — Z23 Encounter for immunization: Secondary | ICD-10-CM | POA: Diagnosis not present

## 2018-04-15 DIAGNOSIS — Z0000 Encounter for general adult medical examination without abnormal findings: Secondary | ICD-10-CM

## 2018-04-15 DIAGNOSIS — Z114 Encounter for screening for human immunodeficiency virus [HIV]: Secondary | ICD-10-CM | POA: Diagnosis not present

## 2018-04-15 MED ORDER — ATORVASTATIN CALCIUM 10 MG PO TABS: 10.0000 mg | ORAL_TABLET | Freq: Every day | ORAL | 3 refills | Status: DC

## 2018-04-15 MED ORDER — METFORMIN HCL 500 MG PO TABS: 500.0000 mg | ORAL_TABLET | Freq: Two times a day (BID) | ORAL | 3 refills | Status: DC

## 2018-04-15 MED ORDER — LISINOPRIL 5 MG PO TABS: 2.5000 mg | ORAL_TABLET | Freq: Every day | ORAL | 3 refills | Status: DC
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
## 2018-04-15 NOTE — Patient Instructions (Addendum)
Health Maintenance, Male A healthy lifestyle and preventive care is important for your health and wellness. Ask your health care provider about what schedule of regular examinations is right for you. What should I know about weight and diet? Eat a Healthy Diet  Eat plenty of vegetables, fruits, whole grains, low-fat dairy products, and lean protein.  Do not eat a lot of foods high in solid fats, added sugars, or salt.  Maintain a Healthy Weight Regular exercise can help you achieve or maintain a healthy weight. You should:  Do at least 150 minutes of exercise each week. The exercise should increase your heart rate and make you sweat (moderate-intensity exercise).  Do strength-training exercises at least twice a week.  Watch Your Levels of Cholesterol and Blood Lipids  Have your blood tested for lipids and cholesterol every 5 years starting at 51 years of age. If you are at high risk for heart disease, you should start having your blood tested when you are 51 years old. You may need to have your cholesterol levels checked more often if: ? Your lipid or cholesterol levels are high. ? You are older than 50 years of age. ? You are at high risk for heart disease.  What should I know about cancer screening? Many types of cancers can be detected early and may often be prevented. Lung Cancer  You should be screened every year for lung cancer if: ? You are a current smoker who has smoked for at least 30 years. ? You are a former smoker who has quit within the past 15 years.  Talk to your health care provider about your screening options, when you should start screening, and how often you should be screened.  Colorectal Cancer  Routine colorectal cancer screening usually begins at 50 years of age and should be repeated every 5-10 years until you are 51 years old. You may need to be screened more often if early forms of precancerous polyps or small growths are found. Your health care provider  may recommend screening at an earlier age if you have risk factors for colon cancer.  Your health care provider may recommend using home test kits to check for hidden blood in the stool.  A small camera at the end of a tube can be used to examine your colon (sigmoidoscopy or colonoscopy). This checks for the earliest forms of colorectal cancer.  Prostate and Testicular Cancer  Depending on your age and overall health, your health care provider may do certain tests to screen for prostate and testicular cancer.  Talk to your health care provider about any symptoms or concerns you have about testicular or prostate cancer.  Skin Cancer  Check your skin from head to toe regularly.  Tell your health care provider about any new moles or changes in moles, especially if: ? There is a change in a mole's size, shape, or color. ? You have a mole that is larger than a pencil eraser.  Always use sunscreen. Apply sunscreen liberally and repeat throughout the day.  Protect yourself by wearing long sleeves, pants, a wide-brimmed hat, and sunglasses when outside.  What should I know about heart disease, diabetes, and high blood pressure?  If you are 18-39 years of age, have your blood pressure checked every 3-5 years. If you are 40 years of age or older, have your blood pressure checked every year. You should have your blood pressure measured twice-once when you are at a hospital or clinic, and once when   you are not at a hospital or clinic. Record the average of the two measurements. To check your blood pressure when you are not at a hospital or clinic, you can use: ? An automated blood pressure machine at a pharmacy. ? A home blood pressure monitor.  Talk to your health care provider about your target blood pressure.  If you are between 63-30 years old, ask your health care provider if you should take aspirin to prevent heart disease.  Have regular diabetes screenings by checking your fasting blood  sugar level. ? If you are at a normal weight and have a low risk for diabetes, have this test once every three years after the age of 50. ? If you are overweight and have a high risk for diabetes, consider being tested at a younger age or more often.  A one-time screening for abdominal aortic aneurysm (AAA) by ultrasound is recommended for men aged 74-75 years who are current or former smokers. What should I know about preventing infection? Hepatitis B If you have a higher risk for hepatitis B, you should be screened for this virus. Talk with your health care provider to find out if you are at risk for hepatitis B infection. Hepatitis C Blood testing is recommended for:  Everyone born from [birth year] through 1964.  Anyone with known risk factors for hepatitis C.  Sexually Transmitted Diseases (STDs)  You should be screened each year for STDs including gonorrhea and chlamydia if: ? You are sexually active and are younger than 51 years of age. ? You are older than 51 years of age and your health care provider tells you that you are at risk for this type of infection. ? Your sexual activity has changed since you were last screened and you are at an increased risk for chlamydia or gonorrhea. Ask your health care provider if you are at risk.  Talk with your health care provider about whether you are at high risk of being infected with HIV. Your health care provider may recommend a prescription medicine to help prevent HIV infection.  What else can I do?  Schedule regular health, dental, and eye exams.  Stay current with your vaccines (immunizations).  Do not use any tobacco products, such as cigarettes, chewing tobacco, and e-cigarettes. If you need help quitting, ask your health care provider.  Limit alcohol intake to no more than 2 drinks per day. One drink equals 12 ounces of beer, 5 ounces of wine, or 1 ounces of hard liquor.  Do not use street drugs.  Do not share needles.  Ask your  health care provider for help if you need support or information about quitting drugs.  Tell your health care provider if you often feel depressed.  Tell your health care provider if you have ever been abused or do not feel safe at home. This information is not intended to replace advice given to you by your health care provider. Make sure you discuss any questions you have with your health care provider. Document Released: 01/12/2008 Document Revised: 03/14/2016 Document Reviewed: 04/19/2015 Elsevier Interactive Patient Education  2018 [employer].     Diabetes Mellitus and Nutrition When you have diabetes (diabetes mellitus), it is very important to have healthy eating habits because your blood sugar (glucose) levels are greatly affected by what you eat and drink. Eating healthy foods in the appropriate amounts, at about the same times every day, can help you:  Control your blood glucose.  Lower your risk of  heart disease.  Improve your blood pressure.  Reach or maintain a healthy weight.  Every person with diabetes is different, and each person has different needs for a meal plan. Your health care provider may recommend that you work with a diet and nutrition specialist (dietitian) to make a meal plan that is best for you. Your meal plan may vary depending on factors such as:  The calories you need.  The medicines you take.  Your weight.  Your blood glucose, blood pressure, and cholesterol levels.  Your activity level.  Other health conditions you have, such as heart or kidney disease.  How do carbohydrates affect me? Carbohydrates affect your blood glucose level more than any other type of food. Eating carbohydrates naturally increases the amount of glucose in your blood. Carbohydrate counting is a method for keeping track of how many carbohydrates you eat. Counting carbohydrates is important to keep your blood glucose at a healthy level, especially if you use insulin or  take certain oral diabetes medicines. It is important to know how many carbohydrates you can safely have in each meal. This is different for every person. Your dietitian can help you calculate how many carbohydrates you should have at each meal and for snack. Foods that contain carbohydrates include:  Bread, cereal, rice, pasta, and crackers.  Potatoes and corn.  Peas, beans, and lentils.  Milk and yogurt.  Fruit and juice.  Desserts, such as cakes, cookies, ice cream, and candy.  How does alcohol affect me? Alcohol can cause a sudden decrease in blood glucose (hypoglycemia), especially if you use insulin or take certain oral diabetes medicines. Hypoglycemia can be a life-threatening condition. Symptoms of hypoglycemia (sleepiness, dizziness, and confusion) are similar to symptoms of having too much alcohol. If your health care provider says that alcohol is safe for you, follow these guidelines:  Limit alcohol intake to no more than 1 drink per day for nonpregnant women and 2 drinks per day for men. One drink equals 12 oz of beer, 5 oz of wine, or 1 oz of hard liquor.  Do not drink on an empty stomach.  Keep yourself hydrated with water, diet soda, or unsweetened iced tea.  Keep in mind that regular soda, juice, and other mixers may contain a lot of sugar and must be counted as carbohydrates.  What are tips for following this plan? Reading food labels  Start by checking the serving size on the label. The amount of calories, carbohydrates, fats, and other nutrients listed on the label are based on one serving of the food. Many foods contain more than one serving per package.  Check the total grams (g) of carbohydrates in one serving. You can calculate the number of servings of carbohydrates in one serving by dividing the total carbohydrates by 15. For example, if a food has 30 g of total carbohydrates, it would be equal to 2 servings of carbohydrates.  Check the number of grams (g)  of saturated and trans fats in one serving. Choose foods that have low or no amount of these fats.  Check the number of milligrams (mg) of sodium in one serving. Most people should limit total sodium intake to less than 2,300 mg per day.  Always check the nutrition information of foods labeled as "low-fat" or "nonfat". These foods may be higher in added sugar or refined carbohydrates and should be avoided.  Talk to your dietitian to identify your daily goals for nutrients listed on the label. Shopping  Avoid buying  canned, premade, or processed foods. These foods tend to be high in fat, sodium, and added sugar.  Shop around the outside edge of the grocery store. This includes fresh fruits and vegetables, bulk grains, fresh meats, and fresh dairy. Cooking  Use low-heat cooking methods, such as baking, instead of high-heat cooking methods like deep frying.  Cook using healthy oils, such as olive, canola, or sunflower oil.  Avoid cooking with butter, cream, or high-fat meats. Meal planning  Eat meals and snacks regularly, preferably at the same times every day. Avoid going long periods of time without eating.  Eat foods high in fiber, such as fresh fruits, vegetables, beans, and whole grains. Talk to your dietitian about how many servings of carbohydrates you can eat at each meal.  Eat 4-6 ounces of lean protein each day, such as lean meat, chicken, fish, eggs, or tofu. 1 ounce is equal to 1 ounce of meat, chicken, or fish, 1 egg, or 1/4 cup of tofu.  Eat some foods each day that contain healthy fats, such as avocado, nuts, seeds, and fish. Lifestyle   Check your blood glucose regularly.  Exercise at least 30 minutes 5 or more days each week, or as told by your health care provider.  Take medicines as told by your health care provider.  Do not use any products that contain nicotine or tobacco, such as cigarettes and e-cigarettes. If you need help quitting, ask your health care  provider.  Work with a Social worker or diabetes educator to identify strategies to manage stress and any emotional and social challenges. What are some questions to ask my health care provider?  Do I need to meet with a diabetes educator?  Do I need to meet with a dietitian?  What number can I call if I have questions?  When are the best times to check my blood glucose? Where to find more information:  American Diabetes Association: diabetes.org/food-and-fitness/food  Academy of Nutrition and Dietetics: PokerClues.dk  [employer] of Diabetes and Digestive and Kidney Diseases (NIH): ContactWire.be Summary  A healthy meal plan will help you control your blood glucose and maintain a healthy lifestyle.  Working with a diet and nutrition specialist (dietitian) can help you make a meal plan that is best for you.  Keep in mind that carbohydrates and alcohol have immediate effects on your blood glucose levels. It is important to count carbohydrates and to use alcohol carefully. This information is not intended to replace advice given to you by your health care provider. Make sure you discuss any questions you have with your health care provider. Document Released: 04/12/2005 Document Revised: 08/20/2016 Document Reviewed: 08/20/2016 Elsevier Interactive Patient Education  [employer].     If you have lab work done today you will be contacted with your lab results within the next 2 weeks.  If you have not heard from [place] then please contact us. The fastest way to get your results is to register for My Chart.   IF you received an x-ray today, you will receive an invoice from [facility]. Please contact [facility] at [phone] with questions or concerns regarding your invoice.   IF you received labwork today, you will receive an invoice  from [place]. Please contact [facility] at [phone] with questions or concerns regarding your invoice.   Our billing staff will not be able to assist you with questions regarding bills from these companies.  You will be contacted with the lab results as soon as they are  available. The fastest way to get your results is to activate your My Chart account. Instructions are located on the last page of this paperwork. If you have not heard from us regarding the results in 2 weeks, please contact this office.      

## 2018-04-16 LAB — LIPID PANEL
CHOL/HDL RATIO: 5.8 ratio — AB (ref 0.0–5.0)
Cholesterol, Total: 173 mg/dL (ref 100–199)
HDL: 30 mg/dL — ABNORMAL LOW (ref >39)
LDL Calculated: 123 mg/dL — ABNORMAL HIGH (ref 0–99)
Triglycerides: 100 mg/dL (ref 0–149)
VLDL Cholesterol Cal: 20 mg/dL (ref 5–40)

## 2018-04-16 LAB — HEMOGLOBIN A1C
Est. average glucose Bld gHb Est-mCnc: 186 mg/dL
HEMOGLOBIN A1C: 8.1 % — AB (ref 4.8–5.6)

## 2018-04-16 LAB — COMPREHENSIVE METABOLIC PANEL
A/G RATIO: 1.5 (ref 1.2–2.2)
ALT: 21 IU/L (ref 0–44)
AST: 21 IU/L (ref 0–40)
Albumin: 4.1 g/dL (ref 3.5–5.5)
Alkaline Phosphatase: 98 IU/L (ref 39–117)
BILIRUBIN TOTAL: 0.6 mg/dL (ref 0.0–1.2)
BUN/Creatinine Ratio: 13 (ref 9–20)
BUN: 14 mg/dL (ref 6–24)
CALCIUM: 9.4 mg/dL (ref 8.7–10.2)
CHLORIDE: 101 mmol/L (ref 96–106)
CO2: 24 mmol/L (ref 20–29)
Creatinine, Ser: 1.11 mg/dL (ref 0.76–1.27)
GFR, EST AFRICAN AMERICAN: 89 mL/min/{1.73_m2} (ref >59)
GFR, EST NON AFRICAN AMERICAN: 77 mL/min/{1.73_m2} (ref >59)
GLOBULIN, TOTAL: 2.7 g/dL (ref 1.5–4.5)
Glucose: 99 mg/dL (ref 65–99)
POTASSIUM: 4.5 mmol/L (ref 3.5–5.2)
SODIUM: 141 mmol/L (ref 134–144)
TOTAL PROTEIN: 6.8 g/dL (ref 6.0–8.5)

## 2018-04-16 LAB — MICROALBUMIN / CREATININE URINE RATIO
CREATININE, UR: 305.3 mg/dL
MICROALBUM., U, RANDOM: 7.8 ug/mL
Microalb/Creat Ratio: 2.6 mg/g creat (ref 0.0–30.0)

## 2018-04-16 LAB — HIV ANTIBODY (ROUTINE TESTING W REFLEX): HIV Screen 4th Generation wRfx: NONREACTIVE

## 2018-04-16 LAB — TSH: TSH: 1.01 u[IU]/mL (ref 0.450–4.500)

## 2018-04-28 ENCOUNTER — Encounter: Payer: Self-pay | Admitting: Gastroenterology

## 2018-06-02 ENCOUNTER — Ambulatory Visit (AMBULATORY_SURGERY_CENTER): Payer: PRIVATE HEALTH INSURANCE

## 2018-06-02 VITALS — Ht 69.0 in | Wt 230.0 lb

## 2018-06-02 DIAGNOSIS — Z1211 Encounter for screening for malignant neoplasm of colon: Secondary | ICD-10-CM

## 2018-06-02 MED ORDER — PLENVU 140 G PO SOLR: 1.0000 | ORAL | 0 refills | Status: DC

## 2018-06-02 NOTE — Progress Notes (Signed)
No egg or soy allergy known to patient  No issues with past sedation with any surgeries  or procedures, no intubation problems  No diet pills per patient No home 02 use per patient  No blood thinners per patient  Pt denies issues with constipation  No A fib or A flutter  EMMI video sent to pt's e mail  Pt. declined 

## 2018-06-03 ENCOUNTER — Encounter: Payer: Self-pay | Admitting: Gastroenterology

## 2018-06-13 ENCOUNTER — Ambulatory Visit (AMBULATORY_SURGERY_CENTER): Payer: BLUE CROSS/BLUE SHIELD | Admitting: Gastroenterology

## 2018-06-13 ENCOUNTER — Encounter: Payer: Self-pay | Admitting: Gastroenterology

## 2018-06-13 VITALS — BP 130/74 | HR 75 | Temp 97.8°F | Resp 12 | Ht 69.0 in | Wt 230.0 lb

## 2018-06-13 DIAGNOSIS — D124 Benign neoplasm of descending colon: Secondary | ICD-10-CM

## 2018-06-13 DIAGNOSIS — Z1211 Encounter for screening for malignant neoplasm of colon: Secondary | ICD-10-CM | POA: Diagnosis not present

## 2018-06-13 MED ORDER — SODIUM CHLORIDE 0.9 % IV SOLN: 500.0000 mL | Freq: Once | INTRAVENOUS | Status: DC

## 2018-06-13 NOTE — Progress Notes (Signed)
Called to room to assist during endoscopic procedure.  Patient ID and intended procedure confirmed with present staff. Received instructions for my participation in the procedure from the performing physician.  

## 2018-06-13 NOTE — Progress Notes (Signed)
Pt's states no medical or surgical changes since previsit or office visit. 

## 2018-06-13 NOTE — Progress Notes (Signed)
Report given to PACU, vss 

## 2018-06-13 NOTE — Patient Instructions (Signed)
Thank you for allowing [place] to care for you today! Resume previous diet and medications today. Return to normal activities tomorrow. Handout provided for polyps. Pathology results by mail, approx 10 days.  Next colonoscopy date will be determined at that time.    YOU HAD AN ENDOSCOPIC PROCEDURE TODAY AT [facility]:   Refer to the procedure report that was given to you for any specific questions about what was found during the examination.  If the procedure report does not answer your questions, please call your gastroenterologist to clarify.  If you requested that your care partner not be given the details of your procedure findings, then the procedure report has been included in a sealed envelope for you to review at your convenience later.  YOU SHOULD EXPECT: Some feelings of bloating in the abdomen. Passage of more gas than usual.  Walking can help get rid of the air that was put into your GI tract during the procedure and reduce the bloating. If you had a lower endoscopy (such as a colonoscopy or flexible sigmoidoscopy) you may notice spotting of blood in your stool or on the toilet paper. If you underwent a bowel prep for your procedure, you may not have a normal bowel movement for a few days.  Please Note:  You might notice some irritation and congestion in your nose or some drainage.  This is from the oxygen used during your procedure.  There is no need for concern and it should clear up in a day or so.  SYMPTOMS TO REPORT IMMEDIATELY:   Following lower endoscopy (colonoscopy or flexible sigmoidoscopy):  Excessive amounts of blood in the stool  Significant tenderness or worsening of abdominal pains  Swelling of the abdomen that is new, acute  Fever of 100F or higher    For urgent or emergent issues, a gastroenterologist can be reached at any hour by calling [phone].   DIET:  We do recommend a small meal at first, but then you may proceed to your regular  diet.  Drink plenty of fluids but you should avoid alcoholic beverages for 24 hours.  ACTIVITY:  You should plan to take it easy for the rest of today and you should NOT DRIVE or use heavy machinery until tomorrow (because of the sedation medicines used during the test).    FOLLOW UP: Our staff will call the number listed on your records the next business day following your procedure to check on you and address any questions or concerns that you may have regarding the information given to you following your procedure. If we do not reach you, we will leave a message.  However, if you are feeling well and you are not experiencing any problems, there is no need to return our call.  We will assume that you have returned to your regular daily activities without incident.  If any biopsies were taken you will be contacted by phone or by letter within the next 1-3 weeks.  Please call us at [phone] if you have not heard about the biopsies in 3 weeks.    SIGNATURES/CONFIDENTIALITY: You and/or your care partner have signed paperwork which will be entered into your electronic medical record.  These signatures attest to the fact that that the information above on your After Visit Summary has been reviewed and is understood.  Full responsibility of the confidentiality of this discharge information lies with you and/or your care-partner.

## 2018-06-13 NOTE — Op Note (Signed)
Billy Rios Procedure Date: 06/13/2018 8:49 AM MRN: [id number] Endoscopist: Mallie Mussel L. Loletha Rios , MD Age: 52 Referring MD:  Date of Birth: 12-Apr-1966 Gender: Male Account #: [id number] Procedure:                Colonoscopy Indications:              Screening for colorectal malignant neoplasm, This                            is the patient's first colonoscopy Medicines:                Monitored Anesthesia Care Procedure:                Pre-Anesthesia Assessment:                           - Prior to the procedure, a History and Physical                            was performed, and patient medications and                            allergies were reviewed. The patient's tolerance of                            previous anesthesia was also reviewed. The risks                            and benefits of the procedure and the sedation                            options and risks were discussed with the patient.                            All questions were answered, and informed consent                            was obtained. Prior Anticoagulants: The patient has                            taken no previous anticoagulant or antiplatelet                            agents. ASA Grade Assessment: II - A patient with                            mild systemic disease. After reviewing the risks                            and benefits, the patient was deemed in                            satisfactory condition to undergo the procedure.  After obtaining informed consent, the colonoscope                            was passed under direct vision. Throughout the                            procedure, the patient's blood pressure, pulse, and                            oxygen saturations were monitored continuously. The                            Colonoscope was introduced through the anus and                            advanced to the the cecum,  identified by                            appendiceal orifice and ileocecal valve. The                            colonoscopy was performed without difficulty. The                            patient tolerated the procedure well. The quality                            of the bowel preparation was excellent. The                            ileocecal valve, appendiceal orifice, and rectum                            were photographed. Scope In: 9:05:56 AM Scope Out: 9:18:28 AM Scope Withdrawal Time: 0 hours 9 minutes 2 seconds  Total Procedure Duration: 0 hours 12 minutes 32 seconds  Findings:                 The perianal and digital rectal examinations were                            normal.                           A 3 mm polyp was found in the descending colon. The                            polyp was sessile. The polyp was removed with a                            cold snare. Resection and retrieval were complete.                           A few small-mouthed diverticula were found in the  left colon.                           The exam was otherwise without abnormality on                            direct and retroflexion views. Complications:            No immediate complications. Estimated Blood Loss:     Estimated blood loss was minimal. Impression:               - One 3 mm polyp in the descending colon, removed                            with a cold snare. Resected and retrieved.                           - Diverticulosis in the left colon.                           - The examination was otherwise normal on direct                            and retroflexion views. Recommendation:           - Patient has a contact number available for                            emergencies. The signs and symptoms of potential                            delayed complications were discussed with the                            patient. Return to normal activities tomorrow.                             Written discharge instructions were provided to the                            patient.                           - Resume previous diet.                           - Continue present medications.                           - Await pathology results.                           - Repeat colonoscopy is recommended for                            surveillance. The colonoscopy date will be  determined after pathology results from today's                            exam become available for review. Billy Rios L. [name], MD 06/13/2018 9:22:48 AM This report has been signed electronically.

## 2018-06-16 ENCOUNTER — Telehealth: Payer: Self-pay | Admitting: *Deleted

## 2018-06-16 NOTE — Telephone Encounter (Signed)
  Follow up Call-  Call back number 06/13/2018  Post procedure Call Back phone  # [phone]  Permission to leave phone message Yes  Some recent data might be hidden     Patient questions:  Do you have a fever, pain , or abdominal swelling? No. Pain Score  0 *  Have you tolerated food without any problems? Yes.    Have you been able to return to your normal activities? Yes.    Do you have any questions about your discharge instructions: Diet   No. Medications  No. Follow up visit  No.  Do you have questions or concerns about your Care? No.  Actions: * If pain score is 4 or above: No action needed, pain <4.

## 2018-06-18 ENCOUNTER — Ambulatory Visit: Payer: BLUE CROSS/BLUE SHIELD | Admitting: Emergency Medicine

## 2018-06-23 ENCOUNTER — Encounter: Payer: Self-pay | Admitting: Gastroenterology

## 2018-07-01 ENCOUNTER — Encounter: Payer: Self-pay | Admitting: Emergency Medicine

## 2018-07-01 ENCOUNTER — Other Ambulatory Visit: Payer: Self-pay

## 2018-07-01 ENCOUNTER — Ambulatory Visit (INDEPENDENT_AMBULATORY_CARE_PROVIDER_SITE_OTHER): Payer: BLUE CROSS/BLUE SHIELD | Admitting: Emergency Medicine

## 2018-07-01 VITALS — BP 132/81 | HR 83 | Temp 98.2°F | Resp 16 | Ht 67.5 in | Wt 220.6 lb

## 2018-07-01 DIAGNOSIS — E119 Type 2 diabetes mellitus without complications: Secondary | ICD-10-CM | POA: Insufficient documentation

## 2018-07-01 DIAGNOSIS — E1165 Type 2 diabetes mellitus with hyperglycemia: Secondary | ICD-10-CM | POA: Insufficient documentation

## 2018-07-01 DIAGNOSIS — E1169 Type 2 diabetes mellitus with other specified complication: Secondary | ICD-10-CM | POA: Insufficient documentation

## 2018-07-01 LAB — LIPID PANEL
CHOL/HDL RATIO: 5.1 ratio — AB (ref 0.0–5.0)
CHOLESTEROL TOTAL: 162 mg/dL (ref 100–199)
HDL: 32 mg/dL — ABNORMAL LOW (ref >39)
LDL CALC: 109 mg/dL — AB (ref 0–99)
TRIGLYCERIDES: 103 mg/dL (ref 0–149)
VLDL Cholesterol Cal: 21 mg/dL (ref 5–40)

## 2018-07-01 LAB — COMPREHENSIVE METABOLIC PANEL
ALBUMIN: 4.3 g/dL (ref 3.5–5.5)
ALK PHOS: 89 IU/L (ref 39–117)
ALT: 30 IU/L (ref 0–44)
AST: 21 IU/L (ref 0–40)
Albumin/Globulin Ratio: 1.6 (ref 1.2–2.2)
BUN/Creatinine Ratio: 18 (ref 9–20)
BUN: 20 mg/dL (ref 6–24)
Bilirubin Total: 0.3 mg/dL (ref 0.0–1.2)
CO2: 21 mmol/L (ref 20–29)
CREATININE: 1.13 mg/dL (ref 0.76–1.27)
Calcium: 9.8 mg/dL (ref 8.7–10.2)
Chloride: 99 mmol/L (ref 96–106)
GFR calc Af Amer: 87 mL/min/{1.73_m2} (ref >59)
GFR, EST NON AFRICAN AMERICAN: 75 mL/min/{1.73_m2} (ref >59)
Globulin, Total: 2.7 g/dL (ref 1.5–4.5)
Glucose: 118 mg/dL — ABNORMAL HIGH (ref 65–99)
Potassium: 5.2 mmol/L (ref 3.5–5.2)
Sodium: 136 mmol/L (ref 134–144)
Total Protein: 7 g/dL (ref 6.0–8.5)

## 2018-07-01 LAB — POCT GLYCOSYLATED HEMOGLOBIN (HGB A1C): HEMOGLOBIN A1C: 7.1 % — AB (ref 4.0–5.6)

## 2018-07-01 LAB — GLUCOSE, POCT (MANUAL RESULT ENTRY): POC Glucose: 114 mg/dl — AB (ref 70–99)

## 2018-07-01 NOTE — Progress Notes (Signed)
Billy Rios 52 y.o.   Chief Complaint  Patient presents with  . Establish Care  . Diabetes    follow up   Lab Results  Component Value Date   HGBA1C 8.1 (H) 04/15/2018   BP Readings from Last 3 Encounters:  07/01/18 132/81  06/13/18 130/74  04/15/18 114/79   Wt Readings from Last 3 Encounters:  07/01/18 220 lb 9.6 oz (100.1 kg)  06/13/18 230 lb (104.3 kg)  06/02/18 230 lb (104.3 kg)    HISTORY OF PRESENT ILLNESS: This is a 52 y.o. male with history of diabetes here for follow-up and to establish care with me.  Doing well.  Has no complaints or medical concerns. Presently taking metformin 500 mg twice a day, lisinopril 5 mg once a day, and Lipitor 10 mg once a day.  HPI   Prior to Admission medications   Medication Sig Start Date End Date Taking? Authorizing Provider  atorvastatin (LIPITOR) 10 MG tablet Take 1 tablet (10 mg total) by mouth daily. 04/15/18  Yes Billy Eagles, PA-C  lisinopril (PRINIVIL,ZESTRIL) 5 MG tablet Take 0.5 tablets (2.5 mg total) by mouth daily. 04/15/18  Yes Billy Eagles, PA-C  metFORMIN (GLUCOPHAGE) 500 MG tablet Take 1 tablet (500 mg total) by mouth 2 (two) times daily with a meal. 04/15/18  Yes Billy Eagles, PA-C    Allergies  Allergen Reactions  . Iohexol Hives    There are no active problems to display for this patient.   Past Medical History:  Diagnosis Date  . Diabetes mellitus without complication ([place])   . Hyperlipidemia   . Hypertension   . Mandible open fracture [facility])     Past Surgical History:  Procedure Laterality Date  . FACIAL FRACTURE SURGERY    . KNEE ARTHROSCOPY Left     Social History   Socioeconomic History  . Marital status: Married    Spouse name: Not on file  . Number of children: Not on file  . Years of education: Not on file  . Highest education level: Not on file  Occupational History  . Not on file  Social Needs  . Financial resource strain: Not on file  . Food insecurity:    Worry:  Not on file    Inability: Not on file  . Transportation needs:    Medical: Not on file    Non-medical: Not on file  Tobacco Use  . Smoking status: Never Smoker  . Smokeless tobacco: Never Used  Substance and Sexual Activity  . Alcohol use: No  . Drug use: No  . Sexual activity: Yes    Birth control/protection: None  Lifestyle  . Physical activity:    Days per week: Not on file    Minutes per session: Not on file  . Stress: Not on file  Relationships  . Social connections:    Talks on phone: Not on file    Gets together: Not on file    Attends religious service: Not on file    Active member of club or organization: Not on file    Attends meetings of clubs or organizations: Not on file    Relationship status: Not on file  . Intimate partner violence:    Fear of current or ex partner: Not on file    Emotionally abused: Not on file    Physically abused: Not on file    Forced sexual activity: Not on file  Other Topics Concern  . Not on file  Social History Narrative  .  Not on file    Family History  Problem Relation Age of Onset  . Diabetes Father   . Hypertension Father   . Diabetes Paternal Grandmother   . Cancer Paternal Grandmother   . Diabetes Brother   . Hypertension Brother   . Colon cancer Neg Hx   . Colon polyps Neg Hx   . Esophageal cancer Neg Hx   . Rectal cancer Neg Hx   . Stomach cancer Neg Hx      Review of Systems  Constitutional: Negative.  Negative for chills and fever.  HENT: Negative.  Negative for congestion, hearing loss, nosebleeds and sore throat.   Eyes: Negative.  Negative for blurred vision and double vision.  Respiratory: Negative.  Negative for cough and shortness of breath.   Cardiovascular: Negative.  Negative for chest pain and palpitations.  Gastrointestinal: Negative for abdominal pain, diarrhea, nausea and vomiting.  Genitourinary: Negative.  Negative for dysuria and hematuria.  Musculoskeletal: Negative.  Negative for myalgias  and neck pain.  Skin: Negative.  Negative for rash.  Neurological: Negative.  Negative for dizziness and headaches.  Endo/Heme/Allergies: Negative.   All other systems reviewed and are negative.    Vitals:   07/01/18 1049  BP: 132/81  Pulse: 83  Resp: 16  Temp: 98.2 F (36.8 C)  SpO2: 96%     Physical Exam  Constitutional: He is oriented to person, place, and time. He appears well-developed and well-nourished.  HENT:  Head: Normocephalic and atraumatic.  Nose: Nose normal.  Mouth/Throat: Oropharynx is clear and moist.  Eyes: Pupils are equal, round, and reactive to light. EOM are normal.  Neck: Normal range of motion. Neck supple.  Cardiovascular: Normal rate, regular rhythm and normal heart sounds.  Pulmonary/Chest: Effort normal and breath sounds normal.  Abdominal: Soft. He exhibits no distension. There is no tenderness.  Musculoskeletal: Normal range of motion. He exhibits no edema or tenderness.  Neurological: He is alert and oriented to person, place, and time. No sensory deficit. He exhibits normal muscle tone. Coordination normal.  Skin: Skin is warm and dry. Capillary refill takes less than 2 seconds.  Psychiatric: He has a normal mood and affect. His behavior is normal.  Vitals reviewed.   Results for orders placed or performed in visit on 07/01/18 (from the past 24 hour(s))  POCT glucose (manual entry)     Status: Abnormal   Collection Time: 07/01/18 11:28 AM  Result Value Ref Range   POC Glucose 114 (A) 70 - 99 mg/dl  POCT glycosylated hemoglobin (Hb A1C)     Status: Abnormal   Collection Time: 07/01/18 11:34 AM  Result Value Ref Range   Hemoglobin A1C 7.1 (A) 4.0 - 5.6 %   HbA1c POC (<> result, manual entry)     HbA1c, POC (prediabetic range)     HbA1c, POC (controlled diabetic range)      ASSESSMENT & PLAN: Type 2 diabetes mellitus without complication, without long-term current use of insulin (HCC) Clinically stable.  Making progress.  Hemoglobin A1c  today better than last.  We will continue present medication and follow-up in 3 months.   Billy Rios was seen today for establish care and diabetes.  Diagnoses and all orders for this visit:  Type 2 diabetes mellitus without complication, without long-term current use of insulin (HCC) -     POCT glycosylated hemoglobin (Hb A1C) -     POCT glucose (manual entry) -     Comprehensive metabolic panel -  Lipid panel    Patient Instructions       If you have lab work done today you will be contacted with your lab results within the next 2 weeks.  If you have not heard from [place] then please contact us. The fastest way to get your results is to register for My Chart.   IF you received an x-ray today, you will receive an invoice from [facility]. Please contact [facility] at [phone] with questions or concerns regarding your invoice.   IF you received labwork today, you will receive an invoice from [place]. Please contact [facility] at [phone] with questions or concerns regarding your invoice.   Our billing staff will not be able to assist you with questions regarding bills from these companies.  You will be contacted with the lab results as soon as they are available. The fastest way to get your results is to activate your My Chart account. Instructions are located on the last page of this paperwork. If you have not heard from [place] regarding the results in 2 weeks, please contact this office.     Diabetes Mellitus and Nutrition When you have diabetes (diabetes mellitus), it is very important to have healthy eating habits because your blood sugar (glucose) levels are greatly affected by what you eat and drink. Eating healthy foods in the appropriate amounts, at about the same times every day, can help you:  Control your blood glucose.  Lower your risk of heart disease.  Improve your blood pressure.  Reach or maintain a healthy weight.  Every person with  diabetes is different, and each person has different needs for a meal plan. Your health care provider may recommend that you work with a diet and nutrition specialist (dietitian) to make a meal plan that is best for you. Your meal plan may vary depending on factors such as:  The calories you need.  The medicines you take.  Your weight.  Your blood glucose, blood pressure, and cholesterol levels.  Your activity level.  Other health conditions you have, such as heart or kidney disease.  How do carbohydrates affect me? Carbohydrates affect your blood glucose level more than any other type of food. Eating carbohydrates naturally increases the amount of glucose in your blood. Carbohydrate counting is a method for keeping track of how many carbohydrates you eat. Counting carbohydrates is important to keep your blood glucose at a healthy level, especially if you use insulin or take certain oral diabetes medicines. It is important to know how many carbohydrates you can safely have in each meal. This is different for every person. Your dietitian can help you calculate how many carbohydrates you should have at each meal and for snack. Foods that contain carbohydrates include:  Bread, cereal, rice, pasta, and crackers.  Potatoes and corn.  Peas, beans, and lentils.  Milk and yogurt.  Fruit and juice.  Desserts, such as cakes, cookies, ice cream, and candy.  How does alcohol affect me? Alcohol can cause a sudden decrease in blood glucose (hypoglycemia), especially if you use insulin or take certain oral diabetes medicines. Hypoglycemia can be a life-threatening condition. Symptoms of hypoglycemia (sleepiness, dizziness, and confusion) are similar to symptoms of having too much alcohol. If your health care provider says that alcohol is safe for you, follow these guidelines:  Limit alcohol intake to no more than 1 drink per day for nonpregnant women and 2 drinks per day for men. One drink equals  12 oz of beer, 5  oz of wine, or 1 oz of hard liquor.  Do not drink on an empty stomach.  Keep yourself hydrated with water, diet soda, or unsweetened iced tea.  Keep in mind that regular soda, juice, and other mixers may contain a lot of sugar and must be counted as carbohydrates.  What are tips for following this plan? Reading food labels  Start by checking the serving size on the label. The amount of calories, carbohydrates, fats, and other nutrients listed on the label are based on one serving of the food. Many foods contain more than one serving per package.  Check the total grams (g) of carbohydrates in one serving. You can calculate the number of servings of carbohydrates in one serving by dividing the total carbohydrates by 15. For example, if a food has 30 g of total carbohydrates, it would be equal to 2 servings of carbohydrates.  Check the number of grams (g) of saturated and trans fats in one serving. Choose foods that have low or no amount of these fats.  Check the number of milligrams (mg) of sodium in one serving. Most people should limit total sodium intake to less than 2,300 mg per day.  Always check the nutrition information of foods labeled as "low-fat" or "nonfat". These foods may be higher in added sugar or refined carbohydrates and should be avoided.  Talk to your dietitian to identify your daily goals for nutrients listed on the label. Shopping  Avoid buying canned, premade, or processed foods. These foods tend to be high in fat, sodium, and added sugar.  Shop around the outside edge of the grocery store. This includes fresh fruits and vegetables, bulk grains, fresh meats, and fresh dairy. Cooking  Use low-heat cooking methods, such as baking, instead of high-heat cooking methods like deep frying.  Cook using healthy oils, such as olive, canola, or sunflower oil.  Avoid cooking with butter, cream, or high-fat meats. Meal planning  Eat meals and snacks  regularly, preferably at the same times every day. Avoid going long periods of time without eating.  Eat foods high in fiber, such as fresh fruits, vegetables, beans, and whole grains. Talk to your dietitian about how many servings of carbohydrates you can eat at each meal.  Eat 4-6 ounces of lean protein each day, such as lean meat, chicken, fish, eggs, or tofu. 1 ounce is equal to 1 ounce of meat, chicken, or fish, 1 egg, or 1/4 cup of tofu.  Eat some foods each day that contain healthy fats, such as avocado, nuts, seeds, and fish. Lifestyle   Check your blood glucose regularly.  Exercise at least 30 minutes 5 or more days each week, or as told by your health care provider.  Take medicines as told by your health care provider.  Do not use any products that contain nicotine or tobacco, such as cigarettes and e-cigarettes. If you need help quitting, ask your health care provider.  Work with a Social worker or diabetes educator to identify strategies to manage stress and any emotional and social challenges. What are some questions to ask my health care provider?  Do I need to meet with a diabetes educator?  Do I need to meet with a dietitian?  What number can I call if I have questions?  When are the best times to check my blood glucose? Where to find more information:  American Diabetes Association: diabetes.org/food-and-fitness/food  Academy of Nutrition and Dietetics: PokerClues.dk  [employer] of Diabetes and Digestive and Kidney  Diseases (NIH): ContactWire.be Summary  A healthy meal plan will help you control your blood glucose and maintain a healthy lifestyle.  Working with a diet and nutrition specialist (dietitian) can help you make a meal plan that is best for you.  Keep in mind that carbohydrates and alcohol have immediate effects on your  blood glucose levels. It is important to count carbohydrates and to use alcohol carefully. This information is not intended to replace advice given to you by your health care provider. Make sure you discuss any questions you have with your health care provider. Document Released: 04/12/2005 Document Revised: 08/20/2016 Document Reviewed: 08/20/2016 Elsevier Interactive Patient Education  2018 [employer].      Agustina Caroli, MD [facility]

## 2018-07-01 NOTE — Assessment & Plan Note (Signed)
Clinically stable.  Making progress.  Hemoglobin A1c today better than last.  We will continue present medication and follow-up in 3 months.

## 2018-07-01 NOTE — Patient Instructions (Addendum)
   If you have lab work done today you will be contacted with your lab results within the next 2 weeks.  If you have not heard from us then please contact us. The fastest way to get your results is to register for My Chart.   IF you received an x-ray today, you will receive an invoice from [facility]. Please contact [facility] at [phone] with questions or concerns regarding your invoice.   IF you received labwork today, you will receive an invoice from [facility]. Please contact [facility] at [phone] with questions or concerns regarding your invoice.   Our billing staff will not be able to assist you with questions regarding bills from these companies.  You will be contacted with the lab results as soon as they are available. The fastest way to get your results is to activate your My Chart account. Instructions are located on the last page of this paperwork. If you have not heard from us regarding the results in 2 weeks, please contact this office.     Diabetes Mellitus and Nutrition When you have diabetes (diabetes mellitus), it is very important to have healthy eating habits because your blood sugar (glucose) levels are greatly affected by what you eat and drink. Eating healthy foods in the appropriate amounts, at about the same times every day, can help you:  Control your blood glucose.  Lower your risk of heart disease.  Improve your blood pressure.  Reach or maintain a healthy weight.  Every person with diabetes is different, and each person has different needs for a meal plan. Your health care provider may recommend that you work with a diet and nutrition specialist (dietitian) to make a meal plan that is best for you. Your meal plan may vary depending on factors such as:  The calories you need.  The medicines you take.  Your weight.  Your blood glucose, blood pressure, and cholesterol levels.  Your activity level.  Other health conditions you  have, such as heart or kidney disease.  How do carbohydrates affect me? Carbohydrates affect your blood glucose level more than any other type of food. Eating carbohydrates naturally increases the amount of glucose in your blood. Carbohydrate counting is a method for keeping track of how many carbohydrates you eat. Counting carbohydrates is important to keep your blood glucose at a healthy level, especially if you use insulin or take certain oral diabetes medicines. It is important to know how many carbohydrates you can safely have in each meal. This is different for every person. Your dietitian can help you calculate how many carbohydrates you should have at each meal and for snack. Foods that contain carbohydrates include:  Bread, cereal, rice, pasta, and crackers.  Potatoes and corn.  Peas, beans, and lentils.  Milk and yogurt.  Fruit and juice.  Desserts, such as cakes, cookies, ice cream, and candy.  How does alcohol affect me? Alcohol can cause a sudden decrease in blood glucose (hypoglycemia), especially if you use insulin or take certain oral diabetes medicines. Hypoglycemia can be a life-threatening condition. Symptoms of hypoglycemia (sleepiness, dizziness, and confusion) are similar to symptoms of having too much alcohol. If your health care provider says that alcohol is safe for you, follow these guidelines:  Limit alcohol intake to no more than 1 drink per day for nonpregnant women and 2 drinks per day for men. One drink equals 12 oz of beer, 5 oz of wine, or 1 oz of hard liquor.  Do   not drink on an empty stomach.  Keep yourself hydrated with water, diet soda, or unsweetened iced tea.  Keep in mind that regular soda, juice, and other mixers may contain a lot of sugar and must be counted as carbohydrates.  What are tips for following this plan? Reading food labels  Start by checking the serving size on the label. The amount of calories, carbohydrates, fats, and other  nutrients listed on the label are based on one serving of the food. Many foods contain more than one serving per package.  Check the total grams (g) of carbohydrates in one serving. You can calculate the number of servings of carbohydrates in one serving by dividing the total carbohydrates by 15. For example, if a food has 30 g of total carbohydrates, it would be equal to 2 servings of carbohydrates.  Check the number of grams (g) of saturated and trans fats in one serving. Choose foods that have low or no amount of these fats.  Check the number of milligrams (mg) of sodium in one serving. Most people should limit total sodium intake to less than 2,300 mg per day.  Always check the nutrition information of foods labeled as "low-fat" or "nonfat". These foods may be higher in added sugar or refined carbohydrates and should be avoided.  Talk to your dietitian to identify your daily goals for nutrients listed on the label. Shopping  Avoid buying canned, premade, or processed foods. These foods tend to be high in fat, sodium, and added sugar.  Shop around the outside edge of the grocery store. This includes fresh fruits and vegetables, bulk grains, fresh meats, and fresh dairy. Cooking  Use low-heat cooking methods, such as baking, instead of high-heat cooking methods like deep frying.  Cook using healthy oils, such as olive, canola, or sunflower oil.  Avoid cooking with butter, cream, or high-fat meats. Meal planning  Eat meals and snacks regularly, preferably at the same times every day. Avoid going long periods of time without eating.  Eat foods high in fiber, such as fresh fruits, vegetables, beans, and whole grains. Talk to your dietitian about how many servings of carbohydrates you can eat at each meal.  Eat 4-6 ounces of lean protein each day, such as lean meat, chicken, fish, eggs, or tofu. 1 ounce is equal to 1 ounce of meat, chicken, or fish, 1 egg, or 1/4 cup of tofu.  Eat some  foods each day that contain healthy fats, such as avocado, nuts, seeds, and fish. Lifestyle   Check your blood glucose regularly.  Exercise at least 30 minutes 5 or more days each week, or as told by your health care provider.  Take medicines as told by your health care provider.  Do not use any products that contain nicotine or tobacco, such as cigarettes and e-cigarettes. If you need help quitting, ask your health care provider.  Work with a counselor or diabetes educator to identify strategies to manage stress and any emotional and social challenges. What are some questions to ask my health care provider?  Do I need to meet with a diabetes educator?  Do I need to meet with a dietitian?  What number can I call if I have questions?  When are the best times to check my blood glucose? Where to find more information:  American Diabetes Association: diabetes.org/food-and-fitness/food  Academy of Nutrition and Dietetics: www.eatright.org/resources/health/diseases-and-conditions/diabetes  National Institute of Diabetes and Digestive and Kidney Diseases (NIH): www.niddk.nih.gov/health-information/diabetes/overview/diet-eating-physical-activity Summary  A healthy meal plan will   help you control your blood glucose and maintain a healthy lifestyle.  Working with a diet and nutrition specialist (dietitian) can help you make a meal plan that is best for you.  Keep in mind that carbohydrates and alcohol have immediate effects on your blood glucose levels. It is important to count carbohydrates and to use alcohol carefully. This information is not intended to replace advice given to you by your health care provider. Make sure you discuss any questions you have with your health care provider. Document Released: 04/12/2005 Document Revised: 08/20/2016 Document Reviewed: 08/20/2016 Elsevier Interactive Patient Education  2018 Elsevier Inc.  

## 2018-07-05 ENCOUNTER — Encounter: Payer: Self-pay | Admitting: Radiology

## 2018-10-03 ENCOUNTER — Ambulatory Visit: Payer: BLUE CROSS/BLUE SHIELD | Admitting: Emergency Medicine

## 2018-11-03 ENCOUNTER — Other Ambulatory Visit: Payer: Self-pay

## 2018-11-03 DIAGNOSIS — E119 Type 2 diabetes mellitus without complications: Secondary | ICD-10-CM

## 2018-11-04 ENCOUNTER — Encounter: Payer: Self-pay | Admitting: Emergency Medicine

## 2018-11-04 ENCOUNTER — Telehealth (INDEPENDENT_AMBULATORY_CARE_PROVIDER_SITE_OTHER): Payer: BLUE CROSS/BLUE SHIELD | Admitting: Emergency Medicine

## 2018-11-04 DIAGNOSIS — E119 Type 2 diabetes mellitus without complications: Secondary | ICD-10-CM

## 2018-11-04 NOTE — Progress Notes (Signed)
Pt c/o follow up on diabetes. Also fell at work and hurt his shoulder is seeing some for this and getting an MRI done. Also was losing weight but had some gain due to hurting his shoulder. No travel.

## 2018-11-04 NOTE — Progress Notes (Signed)
Telemedicine Encounter- SOAP NOTE Established Patient  This telephone encounter was conducted with the patient's (or proxy's) verbal consent via audio telecommunications: yes/no: Yes Patient was instructed to have this encounter in a suitably private space; and to only have persons present to whom they give permission to participate. In addition, patient identity was confirmed by use of name plus two identifiers (DOB and address).  I discussed the limitations, risks, security and privacy concerns of performing an evaluation and management service by telephone and the availability of in person appointments. I also discussed with the patient that there may be a patient responsible charge related to this service. The patient expressed understanding and agreed to proceed.  I spent a total of TIME; 0 MIN TO 60 MIN: 15 minutes talking with the patient or their proxy.  No chief complaint on file. Follow-up on diabetes  Subjective   Billy Rios is a 53 y.o. male established patient. Telephone visit today for diabetes follow-up.  Doing well.  Has no complaints or medical concerns today.  Has been exercising and losing weight.  Down to 211 pounds.  States he feels much better.  Compliant with medications.  No refills needed today.  Taking metformin, ACE inhibitor, and statin.  HPI   Patient Active Problem List   Diagnosis Date Noted  . Type 2 diabetes mellitus without complication, without long-term current use of insulin ([place]) 07/01/2018    Past Medical History:  Diagnosis Date  . Diabetes mellitus without complication ([place])   . Hyperlipidemia   . Hypertension   . Mandible open fracture [facility])     Current Outpatient Medications  Medication Sig Dispense Refill  . atorvastatin (LIPITOR) 10 MG tablet Take 1 tablet (10 mg total) by mouth daily. 90 tablet 3  . lisinopril (PRINIVIL,ZESTRIL) 5 MG tablet Take 0.5 tablets (2.5 mg total) by mouth daily. 90 tablet 3  . metFORMIN  (GLUCOPHAGE) 500 MG tablet Take 1 tablet (500 mg total) by mouth 2 (two) times daily with a meal. 180 tablet 3   No current facility-administered medications for this visit.     Allergies  Allergen Reactions  . Iohexol Hives    Social History   Socioeconomic History  . Marital status: Married    Spouse name: Not on file  . Number of children: Not on file  . Years of education: Not on file  . Highest education level: Not on file  Occupational History  . Not on file  Social Needs  . Financial resource strain: Not on file  . Food insecurity:    Worry: Not on file    Inability: Not on file  . Transportation needs:    Medical: Not on file    Non-medical: Not on file  Tobacco Use  . Smoking status: Never Smoker  . Smokeless tobacco: Never Used  Substance and Sexual Activity  . Alcohol use: No  . Drug use: No  . Sexual activity: Yes    Birth control/protection: None  Lifestyle  . Physical activity:    Days per week: Not on file    Minutes per session: Not on file  . Stress: Not on file  Relationships  . Social connections:    Talks on phone: Not on file    Gets together: Not on file    Attends religious service: Not on file    Active member of club or organization: Not on file    Attends meetings of clubs or organizations: Not on file  Relationship status: Not on file  . Intimate partner violence:    Fear of current or ex partner: Not on file    Emotionally abused: Not on file    Physically abused: Not on file    Forced sexual activity: Not on file  Other Topics Concern  . Not on file  Social History Narrative  . Not on file    Review of Systems  Constitutional: Negative.  Negative for chills and fever.  HENT: Negative for congestion and sore throat.   Respiratory: Negative.  Negative for cough and shortness of breath.   Cardiovascular: Negative.  Negative for chest pain and palpitations.  Gastrointestinal: Negative.  Negative for abdominal pain, blood in  stool, diarrhea, nausea and vomiting.  Genitourinary: Negative for dysuria and hematuria.  Musculoskeletal: Negative for myalgias.  Skin: Negative for rash.  Neurological: Negative for dizziness and headaches.  All other systems reviewed and are negative.   Objective   Vitals as reported by the patient: None available There were no vitals filed for this visit.  Patient is awake and alert and oriented x3 in no apparent respiratory distress while talking. There are no diagnoses linked to this encounter. Diagnoses and all orders for this visit:  Type 2 diabetes mellitus without complication, without long-term current use of insulin ([facility])  Clinically stable.  No medical concerns identified at this time. Continue present medications.  Follow-up in 3 to 6 months.   I discussed the assessment and treatment plan with the patient. The patient was provided an opportunity to ask questions and all were answered. The patient agreed with the plan and demonstrated an understanding of the instructions.   The patient was advised to call back or seek an in-person evaluation if the symptoms worsen or if the condition fails to improve as anticipated.  I provided 15 minutes of non-face-to-face time during this encounter.  [name], MD  Primary Care at [facility]

## 2018-11-06 ENCOUNTER — Ambulatory Visit: Payer: BLUE CROSS/BLUE SHIELD

## 2019-02-13 ENCOUNTER — Other Ambulatory Visit: Payer: BLUE CROSS/BLUE SHIELD

## 2019-02-13 ENCOUNTER — Other Ambulatory Visit: Payer: Self-pay

## 2019-02-13 DIAGNOSIS — R6889 Other general symptoms and signs: Secondary | ICD-10-CM | POA: Diagnosis not present

## 2019-02-13 DIAGNOSIS — Z20822 Contact with and (suspected) exposure to covid-19: Secondary | ICD-10-CM

## 2019-02-17 LAB — NOVEL CORONAVIRUS, NAA: SARS-CoV-2, NAA: NOT DETECTED

## 2019-03-02 ENCOUNTER — Telehealth (INDEPENDENT_AMBULATORY_CARE_PROVIDER_SITE_OTHER): Payer: BC Managed Care – PPO | Admitting: Registered Nurse

## 2019-03-02 ENCOUNTER — Other Ambulatory Visit: Payer: Self-pay

## 2019-03-02 DIAGNOSIS — J988 Other specified respiratory disorders: Secondary | ICD-10-CM | POA: Diagnosis not present

## 2019-03-02 DIAGNOSIS — U071 COVID-19: Secondary | ICD-10-CM

## 2019-03-02 NOTE — Progress Notes (Addendum)
Telemedicine Encounter- SOAP NOTE Established Patient  This telephone encounter was conducted with the patient's (or proxy's) verbal consent via audio telecommunications: yes  Patient was instructed to have this encounter in a suitably private space; and to only have persons present to whom they give permission to participate. In addition, patient identity was confirmed by use of name plus two identifiers (DOB and address).  I discussed the limitations, risks, security and privacy concerns of performing an evaluation and management service by telephone and the availability of in person appointments. I also discussed with the patient that there may be a patient responsible charge related to this service. The patient expressed understanding and agreed to proceed.  I spent a total of 6 minutes talking with the patient or their proxy.  No chief complaint on file.   Subjective   Billy Rios is a 53 y.o. established patient. Telephone visit today for COVID test results and return to work note  HPI Pt states that unfortunately, he has had multiple loved ones pass away from [place] and he and his family are concerned for potential exposure. As a precaution, he has been holding himself out from work until he was able to get a COVID test. He fortunately tested negative and is not experiencing any symptoms at this time.   Patient Active Problem List   Diagnosis Date Noted  . Type 2 diabetes mellitus without complication, without long-term current use of insulin ([place]) 07/01/2018    Past Medical History:  Diagnosis Date  . Diabetes mellitus without complication ([place])   . Hyperlipidemia   . Hypertension   . Mandible open fracture [facility])     Current Outpatient Medications  Medication Sig Dispense Refill  . atorvastatin (LIPITOR) 10 MG tablet Take 1 tablet (10 mg total) by mouth daily. 90 tablet 3  . lisinopril (PRINIVIL,ZESTRIL) 5 MG tablet Take 0.5 tablets (2.5 mg total) by  mouth daily. 90 tablet 3  . metFORMIN (GLUCOPHAGE) 500 MG tablet Take 1 tablet (500 mg total) by mouth 2 (two) times daily with a meal. 180 tablet 3   No current facility-administered medications for this visit.     Allergies  Allergen Reactions  . Iohexol Hives    Social History   Socioeconomic History  . Marital status: Married    Spouse name: Not on file  . Number of children: Not on file  . Years of education: Not on file  . Highest education level: Not on file  Occupational History  . Not on file  Social Needs  . Financial resource strain: Not on file  . Food insecurity    Worry: Not on file    Inability: Not on file  . Transportation needs    Medical: Not on file    Non-medical: Not on file  Tobacco Use  . Smoking status: Never Smoker  . Smokeless tobacco: Never Used  Substance and Sexual Activity  . Alcohol use: No  . Drug use: No  . Sexual activity: Yes    Birth control/protection: None  Lifestyle  . Physical activity    Days per week: Not on file    Minutes per session: Not on file  . Stress: Not on file  Relationships  . Social Herbalist on phone: Not on file    Gets together: Not on file    Attends religious service: Not on file    Active member of club or organization: Not on file    Attends  meetings of clubs or organizations: Not on file    Relationship status: Not on file  . Intimate partner violence    Fear of current or ex partner: Not on file    Emotionally abused: Not on file    Physically abused: Not on file    Forced sexual activity: Not on file  Other Topics Concern  . Not on file  Social History Narrative  . Not on file    Review of Systems  Constitutional: Negative for chills, fever and malaise/fatigue.  HENT: Negative for congestion and sore throat.   Respiratory: Negative for cough, sputum production and shortness of breath.   Cardiovascular: Negative for chest pain.  Gastrointestinal: Negative for constipation,  diarrhea, nausea and vomiting.  Musculoskeletal: Negative for myalgias.  Neurological: Negative for weakness and headaches.    Objective   Vitals as reported by the patient: There were no vitals filed for this visit.  Diagnoses and all orders for this visit:  COVID-19    PLAN  Billy Rios and I reviewed in depth the reasons to be concerned for COVID or to present again for testing. He is interested in having his family tested, and we discussed that they can make appointments with providers here at [facility] who can then order the tests.  I will write him a return to work letter. He states his intent to pick up this letter later this afternoon at our office. I've asked him to call when he arrives so that we can have the letter ready for him before he enters the office.  Patient encouraged to call clinic with any questions, comments, or concerns.   I discussed the assessment and treatment plan with the patient. The patient was provided an opportunity to ask questions and all were answered. The patient agreed with the plan and demonstrated an understanding of the instructions.   The patient was advised to call back or seek an in-person evaluation if the symptoms worsen or if the condition fails to improve as anticipated.  I provided 6 minutes of non-face-to-face time during this encounter.  Maximiano Coss, NP  Primary Care at [facility]

## 2019-03-02 NOTE — Progress Notes (Signed)
needing a letter to return back to work. Having no other symptoms or medical concerns at this time. Looking to return back to work on tomorrow. Letter has been pended

## 2019-03-04 ENCOUNTER — Telehealth: Payer: Self-pay | Admitting: Emergency Medicine

## 2019-03-04 NOTE — Telephone Encounter (Signed)
Covid 19 lab results needed for pt to return to work 7/17. Pt needs to pick up 8/6 so he can go back to work

## 2019-03-04 NOTE — Telephone Encounter (Signed)
Lm message for pt to pick up letter for return to work

## 2019-03-05 NOTE — Telephone Encounter (Signed)
Work note and COVID result provided to patient.  Denies further needs at this time.

## 2019-03-11 NOTE — Addendum Note (Signed)
Addended by: Maximiano Coss on: 03/11/2019 10:57 AM   Modules accepted: Level of Service

## 2019-08-30 ENCOUNTER — Other Ambulatory Visit: Payer: Self-pay

## 2019-08-30 ENCOUNTER — Emergency Department
Admission: EM | Admit: 2019-08-30 | Discharge: 2019-08-30 | Disposition: A | Discharge status: Home / Self Care | Payer: 59 | Attending: Emergency Medicine | Admitting: Emergency Medicine

## 2019-08-30 DIAGNOSIS — R739 Hyperglycemia, unspecified: Secondary | ICD-10-CM

## 2019-08-30 DIAGNOSIS — Z7984 Long term (current) use of oral hypoglycemic drugs: Secondary | ICD-10-CM | POA: Diagnosis not present

## 2019-08-30 DIAGNOSIS — E1165 Type 2 diabetes mellitus with hyperglycemia: Secondary | ICD-10-CM | POA: Insufficient documentation

## 2019-08-30 DIAGNOSIS — E119 Type 2 diabetes mellitus without complications: Secondary | ICD-10-CM

## 2019-08-30 LAB — CBC WITH DIFFERENTIAL/PLATELET
Abs Immature Granulocytes: 0.06 10*3/uL (ref 0.00–0.07)
Basophils Absolute: 0 10*3/uL (ref 0.0–0.1)
Basophils Relative: 0 %
Eosinophils Absolute: 0.1 10*3/uL (ref 0.0–0.5)
Eosinophils Relative: 1 %
HCT: 46.9 % (ref 39.0–52.0)
Hemoglobin: 15.3 g/dL (ref 13.0–17.0)
Immature Granulocytes: 1 %
Lymphocytes Relative: 41 %
Lymphs Abs: 2.8 10*3/uL (ref 0.7–4.0)
MCH: 26.4 pg (ref 26.0–34.0)
MCHC: 32.6 g/dL (ref 30.0–36.0)
MCV: 80.9 fL (ref 80.0–100.0)
Monocytes Absolute: 0.5 10*3/uL (ref 0.1–1.0)
Monocytes Relative: 7 %
Neutro Abs: 3.4 10*3/uL (ref 1.7–7.7)
Neutrophils Relative %: 50 %
Platelets: 260 10*3/uL (ref 150–400)
RBC: 5.8 MIL/uL (ref 4.22–5.81)
RDW: 12.8 % (ref 11.5–15.5)
WBC: 6.8 10*3/uL (ref 4.0–10.5)
nRBC: 0 % (ref 0.0–0.2)

## 2019-08-30 LAB — COMPREHENSIVE METABOLIC PANEL
ALT: 26 U/L (ref 0–44)
AST: 21 U/L (ref 15–41)
Albumin: 4 g/dL (ref 3.5–5.0)
Alkaline Phosphatase: 92 U/L (ref 38–126)
Anion gap: 11 (ref 5–15)
BUN: 19 mg/dL (ref 6–20)
CO2: 25 mmol/L (ref 22–32)
Calcium: 9.2 mg/dL (ref 8.9–10.3)
Chloride: 99 mmol/L (ref 98–111)
Creatinine, Ser: 1.19 mg/dL (ref 0.61–1.24)
GFR calc Af Amer: >60 mL/min (ref >60)
GFR calc non Af Amer: >60 mL/min (ref >60)
Glucose, Bld: 446 mg/dL — ABNORMAL HIGH (ref 70–99)
Potassium: 4.1 mmol/L (ref 3.5–5.1)
Sodium: 135 mmol/L (ref 135–145)
Total Bilirubin: 0.7 mg/dL (ref 0.3–1.2)
Total Protein: 7.3 g/dL (ref 6.5–8.1)

## 2019-08-30 LAB — URINALYSIS, COMPLETE (UACMP) WITH MICROSCOPIC
Bacteria, UA: NONE SEEN
Bilirubin Urine: NEGATIVE
Glucose, UA: >=500 mg/dL — AB
Hgb urine dipstick: NEGATIVE
Ketones, ur: NEGATIVE mg/dL
Leukocytes,Ua: NEGATIVE
Nitrite: NEGATIVE
Protein, ur: NEGATIVE mg/dL
Specific Gravity, Urine: 1.031 — ABNORMAL HIGH (ref 1.005–1.030)
Squamous Epithelial / LPF: NONE SEEN (ref 0–5)
pH: 5 (ref 5.0–8.0)

## 2019-08-30 LAB — GLUCOSE, CAPILLARY
Glucose-Capillary: 442 mg/dL — ABNORMAL HIGH (ref 70–99)
Glucose-Capillary: 335 mg/dL — ABNORMAL HIGH (ref 70–99)
Glucose-Capillary: 276 mg/dL — ABNORMAL HIGH (ref 70–99)

## 2019-08-30 MED ORDER — SODIUM CHLORIDE 0.9 % IV BOLUS
1000.0000 mL | Freq: Once | INTRAVENOUS | Status: AC
  Administered 2019-08-30: 1000 mL via INTRAVENOUS

## 2019-08-30 MED ORDER — INSULIN ASPART 100 UNIT/ML IV SOLN
5.0000 [IU] | Freq: Once | INTRAVENOUS | Status: AC
  Administered 2019-08-30: 5 [IU] via INTRAVENOUS
  Filled 2019-08-30: qty 0.05

## 2019-08-30 MED ORDER — METFORMIN HCL 500 MG PO TABS: 500.0000 mg | ORAL_TABLET | Freq: Two times a day (BID) | ORAL | 0 refills | Status: DC

## 2019-08-30 NOTE — ED Provider Notes (Signed)
[facility] Emergency Department Provider Note ____________________________________________   First MD Initiated Contact with Patient 08/30/19 1845     (approximate)  I have reviewed the triage vital signs and the nursing notes.   HISTORY  Chief Complaint Hyperglycemia  HPI Billy Rios is a 54 y.o. male presents to the emergency department for treatment and evaluation of hyperglycemia.  No history of diabetes.  He states that he began to feel dizzy, somewhat nauseated, and lightheaded not long after he ate some rice, salad with ginger dressing, and chicken.  He states that one of the friends who is a nurse advised him to go to the pharmacy and purchase a glucose meter. The pharmacy staff assisted him to check and it read 507. He was then advised to come to the ER.  Past Medical History:  Diagnosis Date  . Diabetes mellitus without complication ([place])   . Hyperlipidemia   . Mandible open fracture [facility])     Patient Active Problem List   Diagnosis Date Noted  . Type 2 diabetes mellitus without complication, without long-term current use of insulin ([place]) 07/01/2018    Past Surgical History:  Procedure Laterality Date  . FACIAL FRACTURE SURGERY    . KNEE ARTHROSCOPY Left     Prior to Admission medications   Medication Sig Start Date End Date Taking? Authorizing Provider  metFORMIN (GLUCOPHAGE) 500 MG tablet Take 1 tablet (500 mg total) by mouth 2 (two) times daily with a meal. 08/30/19 08/29/20  Kaleel Schmieder, Johnette Abraham B, FNP    Allergies Iohexol  Family History  Problem Relation Age of Onset  . Diabetes Father   . Hypertension Father   . Diabetes Paternal Grandmother   . Cancer Paternal Grandmother   . Diabetes Brother   . Hypertension Brother   . Colon cancer Neg Hx   . Colon polyps Neg Hx   . Esophageal cancer Neg Hx   . Rectal cancer Neg Hx   . Stomach cancer Neg Hx     Social History Social History   Tobacco Use  . Smoking  status: Never Smoker  . Smokeless tobacco: Never Used  Substance Use Topics  . Alcohol use: No  . Drug use: No    Review of Systems  Constitutional: No fever/chills Eyes: Positive for blurred vision. ENT: No sore throat. Cardiovascular: Denies chest pain. Respiratory: Denies shortness of breath. Gastrointestinal: No abdominal pain.  Positive for nausea, no vomiting.  No diarrhea.  No constipation. Genitourinary: Negative for dysuria. Positive for increase in urinary frequency. Musculoskeletal: Negative for back pain. Skin: Negative for rash. Neurological: Negative for headaches, focal weakness or numbness. ____________________________________________   PHYSICAL EXAM:  VITAL SIGNS: ED Triage Vitals  Enc Vitals Group     BP 08/30/19 1801 137/82     Pulse Rate 08/30/19 1801 (!) 104     Resp 08/30/19 1801 16     Temp 08/30/19 1801 98.3 F (36.8 C)     Temp Source 08/30/19 1801 Oral     SpO2 08/30/19 1801 95 %     Weight 08/30/19 1758 210 lb (95.3 kg)     Height 08/30/19 1758 5\' 9"  (1.753 m)     Head Circumference --      Peak Flow --      Pain Score 08/30/19 1758 0     Pain Loc --      Pain Edu? --      Excl. in [place]? --     Constitutional: Alert  and oriented. Well appearing and in no acute distress. Eyes: Conjunctivae are normal. PERRL. EOMI. Head: Atraumatic. Nose: No congestion/rhinnorhea. Mouth/Throat: Mucous membranes are moist.  Oropharynx non-erythematous. Neck: No stridor.   Hematological/Lymphatic/Immunilogical: No cervical lymphadenopathy. Cardiovascular: Normal rate, regular rhythm. Grossly normal heart sounds.  Good peripheral circulation. Respiratory: Normal respiratory effort.  No retractions. Lungs CTAB. Gastrointestinal: Soft and nontender. No distention. No abdominal bruits. No CVA tenderness. Genitourinary:  Musculoskeletal: No lower extremity tenderness nor edema.  No joint effusions. Neurologic:  Normal speech and language. No gross focal  neurologic deficits are appreciated. No gait instability. Skin:  Skin is warm, dry and intact. No rash noted. Psychiatric: Mood and affect are normal. Speech and behavior are normal.  ____________________________________________   LABS (all labs ordered are listed, but only abnormal results are displayed)  Labs Reviewed  URINALYSIS, COMPLETE (UACMP) WITH MICROSCOPIC - Abnormal; Notable for the following components:      Result Value   Color, Urine STRAW (*)    APPearance CLEAR (*)    Specific Gravity, Urine 1.031 (*)    Glucose, UA >=500 (*)    All other components within normal limits  COMPREHENSIVE METABOLIC PANEL - Abnormal; Notable for the following components:   Glucose, Bld 446 (*)    All other components within normal limits  GLUCOSE, CAPILLARY - Abnormal; Notable for the following components:   Glucose-Capillary 442 (*)    All other components within normal limits  GLUCOSE, CAPILLARY - Abnormal; Notable for the following components:   Glucose-Capillary 335 (*)    All other components within normal limits  CBC WITH DIFFERENTIAL/PLATELET  CBG MONITORING, ED  CBG MONITORING, ED  CBG MONITORING, ED  CBG MONITORING, ED  CBG MONITORING, ED  CBG MONITORING, ED  CBG MONITORING, ED  CBG MONITORING, ED  CBG MONITORING, ED  CBG MONITORING, ED  CBG MONITORING, ED  CBG MONITORING, ED  CBG MONITORING, ED  CBG MONITORING, ED  CBG MONITORING, ED  CBG MONITORING, ED   ____________________________________________  EKG  Not indicated. ____________________________________________  RADIOLOGY  ED MD interpretation:    Official radiology report(s): No results found.  ____________________________________________   PROCEDURES  Procedure(s) performed (including Critical Care):  Procedures  ____________________________________________   INITIAL IMPRESSION / ASSESSMENT AND PLAN     54 year old male presenting to the emergency department for treatment and  evaluation of hyperglycemia.  This is new to the patient.  He has no previous diagnosis of diabetes.  See HPI for further details.  Plan will be to administer fluids, evaluate lab studies, and determine whether the patient will need additional fluids or IV insulin.  DIFFERENTIAL DIAGNOSIS  Hyperglycemia, new onset diabetes type 2  ED COURSE  2 L of IV fluids given.  Patient's glucose decreased to 335.  5 units of insulin has been ordered.  Plan will be to discharge him home with a prescription for Metformin 500 twice a day.  Nutrition advice was discussed with the patient.  He will also receive some nutrition planning information in his discharge papers.  He is strongly encouraged to call and schedule follow-up appointment with his primary care provider soon as possible.  He was also asked to use his glucose meter 2 times per day and log his results.  For continuously high glucose readings he is to see primary care or return to the emergency department. ____________________________________________   FINAL CLINICAL IMPRESSION(S) / ED DIAGNOSES  Final diagnoses:  Hyperglycemia  New onset type 2 diabetes mellitus ([place])  ED Discharge Orders         Ordered    metFORMIN (GLUCOPHAGE) 500 MG tablet  2 times daily with meals     08/30/19 2101           Billy Rios was evaluated in Emergency Department on 08/30/2019 for the symptoms described in the history of present illness. He was evaluated in the context of the global COVID-19 pandemic, which necessitated consideration that the patient might be at risk for infection with the SARS-CoV-2 virus that causes COVID-19. Institutional protocols and algorithms that pertain to the evaluation of patients at risk for COVID-19 are in a state of rapid change based on information released by regulatory bodies including the CDC and federal and state organizations. These policies and algorithms were followed during the patient's care in the  ED.   Note:  This document was prepared using Dragon voice recognition software and may include unintentional dictation errors.   Victorino Dike, FNP 08/30/19 2106    Lavonia Drafts, MD 08/30/19 2221

## 2019-08-30 NOTE — ED Triage Notes (Signed)
Pt reports that he had CBG checked at [facility] and they told him it was 67 - Pt is type 2 diabetic - pt states he just doesn't "feel right" - he reports increased urination and increased thirst x24 hours

## 2019-08-30 NOTE — Discharge Instructions (Addendum)
Please call and schedule a follow up appointment with your primary care provider.   Read over the information on nutrition for diabetics and follow as closely as possible.  Use your glucose meter 2 times per day and log your readings.  Return to the ER for symptoms of concern if unable to schedule and appointment.

## 2019-09-01 ENCOUNTER — Other Ambulatory Visit: Payer: Self-pay

## 2019-09-01 ENCOUNTER — Ambulatory Visit: Payer: 59 | Admitting: Emergency Medicine

## 2019-09-01 ENCOUNTER — Encounter: Payer: Self-pay | Admitting: Emergency Medicine

## 2019-09-01 VITALS — BP 108/75 | HR 105 | Temp 98.1°F | Resp 16 | Ht 69.5 in | Wt 210.0 lb

## 2019-09-01 DIAGNOSIS — E1165 Type 2 diabetes mellitus with hyperglycemia: Secondary | ICD-10-CM | POA: Diagnosis not present

## 2019-09-01 DIAGNOSIS — Z23 Encounter for immunization: Secondary | ICD-10-CM

## 2019-09-01 LAB — POCT GLYCOSYLATED HEMOGLOBIN (HGB A1C): Hemoglobin A1C: 12.2 % — AB (ref 4.0–5.6)

## 2019-09-01 LAB — GLUCOSE, POCT (MANUAL RESULT ENTRY): POC Glucose: 264 mg/dl — AB (ref 70–99)

## 2019-09-01 MED ORDER — ROSUVASTATIN CALCIUM 10 MG PO TABS: 10.0000 mg | ORAL_TABLET | Freq: Every day | ORAL | 3 refills | Status: DC

## 2019-09-01 MED ORDER — GLIPIZIDE 5 MG PO TABS: 5.0000 mg | ORAL_TABLET | Freq: Two times a day (BID) | ORAL | 1 refills | Status: DC

## 2019-09-01 MED ORDER — METFORMIN HCL 1000 MG PO TABS: 1000.0000 mg | ORAL_TABLET | Freq: Two times a day (BID) | ORAL | 3 refills | Status: DC

## 2019-09-01 NOTE — Patient Instructions (Addendum)
   If you have lab work done today you will be contacted with your lab results within the next 2 weeks.  If you have not heard from us then please contact us. The fastest way to get your results is to register for My Chart.   IF you received an x-ray today, you will receive an invoice from [facility]. Please contact  Radiology at [phone] with questions or concerns regarding your invoice.   IF you received labwork today, you will receive an invoice from [facility]. Please contact [facility] at [phone] with questions or concerns regarding your invoice.   Our billing staff will not be able to assist you with questions regarding bills from these companies.  You will be contacted with the lab results as soon as they are available. The fastest way to get your results is to activate your My Chart account. Instructions are located on the last page of this paperwork. If you have not heard from us regarding the results in 2 weeks, please contact this office.     Diabetes Mellitus and Nutrition, Adult When you have diabetes (diabetes mellitus), it is very important to have healthy eating habits because your blood sugar (glucose) levels are greatly affected by what you eat and drink. Eating healthy foods in the appropriate amounts, at about the same times every day, can help you:  Control your blood glucose.  Lower your risk of heart disease.  Improve your blood pressure.  Reach or maintain a healthy weight. Every person with diabetes is different, and each person has different needs for a meal plan. Your health care provider may recommend that you work with a diet and nutrition specialist (dietitian) to make a meal plan that is best for you. Your meal plan may vary depending on factors such as:  The calories you need.  The medicines you take.  Your weight.  Your blood glucose, blood pressure, and cholesterol levels.  Your activity level.  Other health conditions  you have, such as heart or kidney disease. How do carbohydrates affect me? Carbohydrates, also called carbs, affect your blood glucose level more than any other type of food. Eating carbs naturally raises the amount of glucose in your blood. Carb counting is a method for keeping track of how many carbs you eat. Counting carbs is important to keep your blood glucose at a healthy level, especially if you use insulin or take certain oral diabetes medicines. It is important to know how many carbs you can safely have in each meal. This is different for every person. Your dietitian can help you calculate how many carbs you should have at each meal and for each snack. Foods that contain carbs include:  Bread, cereal, rice, pasta, and crackers.  Potatoes and corn.  Peas, beans, and lentils.  Milk and yogurt.  Fruit and juice.  Desserts, such as cakes, cookies, ice cream, and candy. How does alcohol affect me? Alcohol can cause a sudden decrease in blood glucose (hypoglycemia), especially if you use insulin or take certain oral diabetes medicines. Hypoglycemia can be a life-threatening condition. Symptoms of hypoglycemia (sleepiness, dizziness, and confusion) are similar to symptoms of having too much alcohol. If your health care provider says that alcohol is safe for you, follow these guidelines:  Limit alcohol intake to no more than 1 drink per day for nonpregnant women and 2 drinks per day for men. One drink equals 12 oz of beer, 5 oz of wine, or 1 oz of hard liquor.    Do not drink on an empty stomach.  Keep yourself hydrated with water, diet soda, or unsweetened iced tea.  Keep in mind that regular soda, juice, and other mixers may contain a lot of sugar and must be counted as carbs. What are tips for following this plan?  Reading food labels  Start by checking the serving size on the "Nutrition Facts" label of packaged foods and drinks. The amount of calories, carbs, fats, and other  nutrients listed on the label is based on one serving of the item. Many items contain more than one serving per package.  Check the total grams (g) of carbs in one serving. You can calculate the number of servings of carbs in one serving by dividing the total carbs by 15. For example, if a food has 30 g of total carbs, it would be equal to 2 servings of carbs.  Check the number of grams (g) of saturated and trans fats in one serving. Choose foods that have low or no amount of these fats.  Check the number of milligrams (mg) of salt (sodium) in one serving. Most people should limit total sodium intake to less than 2,300 mg per day.  Always check the nutrition information of foods labeled as "low-fat" or "nonfat". These foods may be higher in added sugar or refined carbs and should be avoided.  Talk to your dietitian to identify your daily goals for nutrients listed on the label. Shopping  Avoid buying canned, premade, or processed foods. These foods tend to be high in fat, sodium, and added sugar.  Shop around the outside edge of the grocery store. This includes fresh fruits and vegetables, bulk grains, fresh meats, and fresh dairy. Cooking  Use low-heat cooking methods, such as baking, instead of high-heat cooking methods like deep frying.  Cook using healthy oils, such as olive, canola, or sunflower oil.  Avoid cooking with butter, cream, or high-fat meats. Meal planning  Eat meals and snacks regularly, preferably at the same times every day. Avoid going long periods of time without eating.  Eat foods high in fiber, such as fresh fruits, vegetables, beans, and whole grains. Talk to your dietitian about how many servings of carbs you can eat at each meal.  Eat 4-6 ounces (oz) of lean protein each day, such as lean meat, chicken, fish, eggs, or tofu. One oz of lean protein is equal to: ? 1 oz of meat, chicken, or fish. ? 1 egg. ?  cup of tofu.  Eat some foods each day that contain  healthy fats, such as avocado, nuts, seeds, and fish. Lifestyle  Check your blood glucose regularly.  Exercise regularly as told by your health care provider. This may include: ? 150 minutes of moderate-intensity or vigorous-intensity exercise each week. This could be brisk walking, biking, or water aerobics. ? Stretching and doing strength exercises, such as yoga or weightlifting, at least 2 times a week.  Take medicines as told by your health care provider.  Do not use any products that contain nicotine or tobacco, such as cigarettes and e-cigarettes. If you need help quitting, ask your health care provider.  Work with a counselor or diabetes educator to identify strategies to manage stress and any emotional and social challenges. Questions to ask a health care provider  Do I need to meet with a diabetes educator?  Do I need to meet with a dietitian?  What number can I call if I have questions?  When are the best times to   times to check my blood glucose? Where to find more information:  American Diabetes Association: diabetes.org  Academy of Nutrition and Dietetics: www.eatright.org  National Institute of Diabetes and Digestive and Kidney Diseases (NIH): www.niddk.nih.gov Summary  A healthy meal plan will help you control your blood glucose and maintain a healthy lifestyle.  Working with a diet and nutrition specialist (dietitian) can help you make a meal plan that is best for you.  Keep in mind that carbohydrates (carbs) and alcohol have immediate effects on your blood glucose levels. It is important to count carbs and to use alcohol carefully. This information is not intended to replace advice given to you by your health care provider. Make sure you discuss any questions you have with your health care provider. Document Revised: 06/28/2017 Document Reviewed: 08/20/2016 Elsevier Patient Education  2020 [employer]  

## 2019-09-01 NOTE — Progress Notes (Signed)
Billy Rios 54 y.o.   Chief Complaint  Patient presents with  . Diabetes    to check A1c    HISTORY OF PRESENT ILLNESS: This is a 54 y.o. male with history of diabetes here for follow-up.  Last visit with me in December 2019.  Was off medications for a while and had to go to the emergency department on 08/30/2019 with hyperglycemia.  Given 2 L of normal saline IV along with some insulin.  Advised to follow-up with PCP.  Presently taking Metformin 500 mg twice a day.  Asymptomatic today. BP Readings from Last 3 Encounters:  09/01/19 108/75  08/30/19 137/80  07/01/18 132/81   Lab Results  Component Value Date   CHOL 162 07/01/2018   HDL 32 (L) 07/01/2018   LDLCALC 109 (H) 07/01/2018   TRIG 103 07/01/2018   CHOLHDL 5.1 (H) 07/01/2018   Lab Results  Component Value Date   CREATININE 1.19 08/30/2019   BUN 19 08/30/2019   NA 135 08/30/2019   K 4.1 08/30/2019   CL 99 08/30/2019   CO2 25 08/30/2019   Wt Readings from Last 3 Encounters:  09/01/19 210 lb (95.3 kg)  08/30/19 210 lb (95.3 kg)  07/01/18 220 lb 9.6 oz (100.1 kg)     HPI   Prior to Admission medications   Medication Sig Start Date End Date Taking? Authorizing Provider  rosuvastatin (CRESTOR) 10 MG tablet Take 1 tablet (10 mg total) by mouth daily. 09/01/19  Yes Zeena Starkel, [name], MD  glipiZIDE (GLUCOTROL) 5 MG tablet Take 1 tablet (5 mg total) by mouth 2 (two) times daily with a meal. 09/01/19 11/30/19  [name], MD  metFORMIN (GLUCOPHAGE) 1000 MG tablet Take 1 tablet (1,000 mg total) by mouth 2 (two) times daily with a meal. 09/01/19   [name], MD    Allergies  Allergen Reactions  . Iohexol Hives    Patient Active Problem List   Diagnosis Date Noted  . Type 2 diabetes mellitus without complication, without long-term current use of insulin ([place]) 07/01/2018    Past Medical History:  Diagnosis Date  . Diabetes mellitus without complication ([place])   . Hyperlipidemia    . Mandible open fracture [facility])     Past Surgical History:  Procedure Laterality Date  . FACIAL FRACTURE SURGERY    . KNEE ARTHROSCOPY Left     Social History   Socioeconomic History  . Marital status: Married    Spouse name: Not on file  . Number of children: Not on file  . Years of education: Not on file  . Highest education level: Not on file  Occupational History  . Not on file  Tobacco Use  . Smoking status: Never Smoker  . Smokeless tobacco: Never Used  Substance and Sexual Activity  . Alcohol use: No  . Drug use: No  . Sexual activity: Yes    Birth control/protection: None  Other Topics Concern  . Not on file  Social History Narrative  . Not on file   Social Determinants of Health   Financial Resource Strain:   . Difficulty of Paying Living Expenses: Not on file  Food Insecurity:   . Worried About Charity fundraiser in the Last Year: Not on file  . Ran Out of Food in the Last Year: Not on file  Transportation Needs:   . Lack of Transportation (Medical): Not on file  . Lack of Transportation (Non-Medical): Not on file  Physical Activity:   .  Days of Exercise per Week: Not on file  . Minutes of Exercise per Session: Not on file  Stress:   . Feeling of Stress : Not on file  Social Connections:   . Frequency of Communication with Friends and Family: Not on file  . Frequency of Social Gatherings with Friends and Family: Not on file  . Attends Religious Services: Not on file  . Active Member of Clubs or Organizations: Not on file  . Attends Archivist Meetings: Not on file  . Marital Status: Not on file  Intimate Partner Violence:   . Fear of Current or Ex-Partner: Not on file  . Emotionally Abused: Not on file  . Physically Abused: Not on file  . Sexually Abused: Not on file    Family History  Problem Relation Age of Onset  . Diabetes Father   . Hypertension Father   . Diabetes Paternal Grandmother   . Cancer Paternal Grandmother   .  Diabetes Brother   . Hypertension Brother   . Colon cancer Neg Hx   . Colon polyps Neg Hx   . Esophageal cancer Neg Hx   . Rectal cancer Neg Hx   . Stomach cancer Neg Hx      Review of Systems  Constitutional: Negative.  Negative for chills and fever.  HENT: Negative.  Negative for congestion and sore throat.   Respiratory: Negative.  Negative for cough and shortness of breath.   Cardiovascular: Negative.  Negative for chest pain and palpitations.  Gastrointestinal: Negative.  Negative for abdominal pain, diarrhea, nausea and vomiting.  Genitourinary: Negative.  Negative for dysuria and hematuria.  Musculoskeletal: Negative.  Negative for myalgias.  Skin: Negative.  Negative for rash.  Neurological: Negative.  Negative for dizziness and headaches.  Endo/Heme/Allergies: Negative.   All other systems reviewed and are negative.  Today's Vitals   09/01/19 1612  BP: 108/75  Pulse: (!) 105  Resp: 16  Temp: 98.1 F (36.7 C)  TempSrc: Temporal  SpO2: 95%  Weight: 210 lb (95.3 kg)  Height: 5' 9.5" (1.765 m)   Body mass index is 30.57 kg/m.   Physical Exam Vitals reviewed.  Constitutional:      Appearance: Normal appearance.  HENT:     Head: Normocephalic.  Eyes:     Extraocular Movements: Extraocular movements intact.     Conjunctiva/sclera: Conjunctivae normal.     Pupils: Pupils are equal, round, and reactive to light.  Cardiovascular:     Rate and Rhythm: Normal rate and regular rhythm.     Pulses: Normal pulses.     Heart sounds: Normal heart sounds.  Pulmonary:     Effort: Pulmonary effort is normal.     Breath sounds: Normal breath sounds.  Abdominal:     General: Bowel sounds are normal. There is no distension.     Palpations: Abdomen is soft.     Tenderness: There is no abdominal tenderness.  Musculoskeletal:        General: Normal range of motion.     Cervical back: Normal range of motion and neck supple.  Skin:    General: Skin is warm and dry.      Capillary Refill: Capillary refill takes less than 2 seconds.  Neurological:     General: No focal deficit present.     Mental Status: He is alert and oriented to person, place, and time.  Psychiatric:        Mood and Affect: Mood normal.  Behavior: Behavior normal.    Results for orders placed or performed in visit on 09/01/19 (from the past 24 hour(s))  POCT glucose (manual entry)     Status: Abnormal   Collection Time: 09/01/19  4:28 PM  Result Value Ref Range   POC Glucose 264 (A) 70 - 99 mg/dl  POCT glycosylated hemoglobin (Hb A1C)     Status: Abnormal   Collection Time: 09/01/19  4:28 PM  Result Value Ref Range   Hemoglobin A1C 12.2 (A) 4.0 - 5.6 %   HbA1c POC (<> result, manual entry)     HbA1c, POC (prediabetic range)     HbA1c, POC (controlled diabetic range)     A total of 30 minutes was spent with the patient, greater than 50% of which was in counseling/coordination of care regarding diabetes and treatment including diet, nutrition, new medications medications, hypoglycemia precautions, cardiovascular risks associated with it, review of most recent office visit notes, review of most recent blood work, prognosis, and need for follow-up.   ASSESSMENT & PLAN: Billy Rios was seen today for diabetes.  Diagnoses and all orders for this visit:  Type 2 diabetes mellitus with hyperglycemia, without long-term current use of insulin (H[facility]) -     Discontinue: rosuvastatin (CRESTOR) 10 MG tablet; Take 1 tablet (10 mg total) by mouth daily. -     POCT glucose (manual entry) -     POCT glycosylated hemoglobin (Hb A1C) -     Ambulatory referral to Ophthalmology -     rosuvastatin (CRESTOR) 10 MG tablet; Take 1 tablet (10 mg total) by mouth daily. -     HM Diabetes Foot Exam -     metFORMIN (GLUCOPHAGE) 1000 MG tablet; Take 1 tablet (1,000 mg total) by mouth 2 (two) times daily with a meal. -     glipiZIDE (GLUCOTROL) 5 MG tablet; Take 1 tablet (5 mg total) by mouth 2 (two) times  daily with a meal.  Need for prophylactic vaccination and inoculation against influenza -     Flu Vaccine QUAD 36+ mos IM    Patient Instructions       If you have lab work done today you will be contacted with your lab results within the next 2 weeks.  If you have not heard from [place] then please contact us. The fastest way to get your results is to register for My Chart.   IF you received an x-ray today, you will receive an invoice from [facility]. Please contact [facility] at [phone] with questions or concerns regarding your invoice.   IF you received labwork today, you will receive an invoice from [facility]. Please contact [facility] at [phone] with questions or concerns regarding your invoice.   Our billing staff will not be able to assist you with questions regarding bills from these companies.  You will be contacted with the lab results as soon as they are available. The fastest way to get your results is to activate your My Chart account. Instructions are located on the last page of this paperwork. If you have not heard from [place] regarding the results in 2 weeks, please contact this office.     Diabetes Mellitus and Nutrition, Adult When you have diabetes (diabetes mellitus), it is very important to have healthy eating habits because your blood sugar (glucose) levels are greatly affected by what you eat and drink. Eating healthy foods in the appropriate amounts, at about the same times every day, can help you:  Control your blood  glucose.  Lower your risk of heart disease.  Improve your blood pressure.  Reach or maintain a healthy weight. Every person with diabetes is different, and each person has different needs for a meal plan. Your health care provider may recommend that you work with a diet and nutrition specialist (dietitian) to make a meal plan that is best for you. Your meal plan may vary depending on factors such as:  The calories you need.   The medicines you take.  Your weight.  Your blood glucose, blood pressure, and cholesterol levels.  Your activity level.  Other health conditions you have, such as heart or kidney disease. How do carbohydrates affect me? Carbohydrates, also called carbs, affect your blood glucose level more than any other type of food. Eating carbs naturally raises the amount of glucose in your blood. Carb counting is a method for keeping track of how many carbs you eat. Counting carbs is important to keep your blood glucose at a healthy level, especially if you use insulin or take certain oral diabetes medicines. It is important to know how many carbs you can safely have in each meal. This is different for every person. Your dietitian can help you calculate how many carbs you should have at each meal and for each snack. Foods that contain carbs include:  Bread, cereal, rice, pasta, and crackers.  Potatoes and corn.  Peas, beans, and lentils.  Milk and yogurt.  Fruit and juice.  Desserts, such as cakes, cookies, ice cream, and candy. How does alcohol affect me? Alcohol can cause a sudden decrease in blood glucose (hypoglycemia), especially if you use insulin or take certain oral diabetes medicines. Hypoglycemia can be a life-threatening condition. Symptoms of hypoglycemia (sleepiness, dizziness, and confusion) are similar to symptoms of having too much alcohol. If your health care provider says that alcohol is safe for you, follow these guidelines:  Limit alcohol intake to no more than 1 drink per day for nonpregnant women and 2 drinks per day for men. One drink equals 12 oz of beer, 5 oz of wine, or 1 oz of hard liquor.  Do not drink on an empty stomach.  Keep yourself hydrated with water, diet soda, or unsweetened iced tea.  Keep in mind that regular soda, juice, and other mixers may contain a lot of sugar and must be counted as carbs. What are tips for following this plan?  Reading food  labels  Start by checking the serving size on the "Nutrition Facts" label of packaged foods and drinks. The amount of calories, carbs, fats, and other nutrients listed on the label is based on one serving of the item. Many items contain more than one serving per package.  Check the total grams (g) of carbs in one serving. You can calculate the number of servings of carbs in one serving by dividing the total carbs by 15. For example, if a food has 30 g of total carbs, it would be equal to 2 servings of carbs.  Check the number of grams (g) of saturated and trans fats in one serving. Choose foods that have low or no amount of these fats.  Check the number of milligrams (mg) of salt (sodium) in one serving. Most people should limit total sodium intake to less than 2,300 mg per day.  Always check the nutrition information of foods labeled as "low-fat" or "nonfat". These foods may be higher in added sugar or refined carbs and should be avoided.  Talk to your dietitian  to identify your daily goals for nutrients listed on the label. Shopping  Avoid buying canned, premade, or processed foods. These foods tend to be high in fat, sodium, and added sugar.  Shop around the outside edge of the grocery store. This includes fresh fruits and vegetables, bulk grains, fresh meats, and fresh dairy. Cooking  Use low-heat cooking methods, such as baking, instead of high-heat cooking methods like deep frying.  Cook using healthy oils, such as olive, canola, or sunflower oil.  Avoid cooking with butter, cream, or high-fat meats. Meal planning  Eat meals and snacks regularly, preferably at the same times every day. Avoid going long periods of time without eating.  Eat foods high in fiber, such as fresh fruits, vegetables, beans, and whole grains. Talk to your dietitian about how many servings of carbs you can eat at each meal.  Eat 4-6 ounces (oz) of lean protein each day, such as lean meat, chicken, fish,  eggs, or tofu. One oz of lean protein is equal to: ? 1 oz of meat, chicken, or fish. ? 1 egg. ?  cup of tofu.  Eat some foods each day that contain healthy fats, such as avocado, nuts, seeds, and fish. Lifestyle  Check your blood glucose regularly.  Exercise regularly as told by your health care provider. This may include: ? 150 minutes of moderate-intensity or vigorous-intensity exercise each week. This could be brisk walking, biking, or water aerobics. ? Stretching and doing strength exercises, such as yoga or weightlifting, at least 2 times a week.  Take medicines as told by your health care provider.  Do not use any products that contain nicotine or tobacco, such as cigarettes and e-cigarettes. If you need help quitting, ask your health care provider.  Work with a Social worker or diabetes educator to identify strategies to manage stress and any emotional and social challenges. Questions to ask a health care provider  Do I need to meet with a diabetes educator?  Do I need to meet with a dietitian?  What number can I call if I have questions?  When are the best times to check my blood glucose? Where to find more information:  American Diabetes Association: diabetes.org  Academy of Nutrition and Dietetics: www.eatright.[employer] of Diabetes and Digestive and Kidney Diseases (NIH): [employer] Summary  A healthy meal plan will help you control your blood glucose and maintain a healthy lifestyle.  Working with a diet and nutrition specialist (dietitian) can help you make a meal plan that is best for you.  Keep in mind that carbohydrates (carbs) and alcohol have immediate effects on your blood glucose levels. It is important to count carbs and to use alcohol carefully. This information is not intended to replace advice given to you by your health care provider. Make sure you discuss any questions you have with your health care provider. Document Revised:  06/28/2017 Document Reviewed: 08/20/2016 Elsevier Patient Education  2020 Elsevier Inc.      Agustina Caroli, MD [facility]

## 2019-09-29 ENCOUNTER — Ambulatory Visit: Payer: 59 | Admitting: Emergency Medicine

## 2019-09-29 ENCOUNTER — Encounter: Payer: Self-pay | Admitting: Emergency Medicine

## 2019-09-29 ENCOUNTER — Other Ambulatory Visit: Payer: Self-pay

## 2019-09-29 DIAGNOSIS — E1165 Type 2 diabetes mellitus with hyperglycemia: Secondary | ICD-10-CM

## 2019-09-29 LAB — POCT GLYCOSYLATED HEMOGLOBIN (HGB A1C): Hemoglobin A1C: 10.6 % — AB (ref 4.0–5.6)

## 2019-09-29 LAB — GLUCOSE, POCT (MANUAL RESULT ENTRY): POC Glucose: 113 mg/dl — AB (ref 70–99)

## 2019-09-29 NOTE — Assessment & Plan Note (Signed)
Much improved.  Numbers improving.  Continue present medications, diet and nutrition, and physical exercise. Follow-up in 3 months. Continue statin therapy.

## 2019-09-29 NOTE — Patient Instructions (Addendum)
   If you have lab work done today you will be contacted with your lab results within the next 2 weeks.  If you have not heard from us then please contact us. The fastest way to get your results is to register for My Chart.   IF you received an x-ray today, you will receive an invoice from [facility]. Please contact [facility] at [phone] with questions or concerns regarding your invoice.   IF you received labwork today, you will receive an invoice from [facility]. Please contact [facility] at [phone] with questions or concerns regarding your invoice.   Our billing staff will not be able to assist you with questions regarding bills from these companies.  You will be contacted with the lab results as soon as they are available. The fastest way to get your results is to activate your My Chart account. Instructions are located on the last page of this paperwork. If you have not heard from us regarding the results in 2 weeks, please contact this office.     Diabetes Mellitus and Nutrition, Adult When you have diabetes (diabetes mellitus), it is very important to have healthy eating habits because your blood sugar (glucose) levels are greatly affected by what you eat and drink. Eating healthy foods in the appropriate amounts, at about the same times every day, can help you:  Control your blood glucose.  Lower your risk of heart disease.  Improve your blood pressure.  Reach or maintain a healthy weight. Every person with diabetes is different, and each person has different needs for a meal plan. Your health care provider may recommend that you work with a diet and nutrition specialist (dietitian) to make a meal plan that is best for you. Your meal plan may vary depending on factors such as:  The calories you need.  The medicines you take.  Your weight.  Your blood glucose, blood pressure, and cholesterol levels.  Your activity level.  Other health conditions  you have, such as heart or kidney disease. How do carbohydrates affect me? Carbohydrates, also called carbs, affect your blood glucose level more than any other type of food. Eating carbs naturally raises the amount of glucose in your blood. Carb counting is a method for keeping track of how many carbs you eat. Counting carbs is important to keep your blood glucose at a healthy level, especially if you use insulin or take certain oral diabetes medicines. It is important to know how many carbs you can safely have in each meal. This is different for every person. Your dietitian can help you calculate how many carbs you should have at each meal and for each snack. Foods that contain carbs include:  Bread, cereal, rice, pasta, and crackers.  Potatoes and corn.  Peas, beans, and lentils.  Milk and yogurt.  Fruit and juice.  Desserts, such as cakes, cookies, ice cream, and candy. How does alcohol affect me? Alcohol can cause a sudden decrease in blood glucose (hypoglycemia), especially if you use insulin or take certain oral diabetes medicines. Hypoglycemia can be a life-threatening condition. Symptoms of hypoglycemia (sleepiness, dizziness, and confusion) are similar to symptoms of having too much alcohol. If your health care provider says that alcohol is safe for you, follow these guidelines:  Limit alcohol intake to no more than 1 drink per day for nonpregnant women and 2 drinks per day for men. One drink equals 12 oz of beer, 5 oz of wine, or 1 oz of hard liquor.    Do not drink on an empty stomach.  Keep yourself hydrated with water, diet soda, or unsweetened iced tea.  Keep in mind that regular soda, juice, and other mixers may contain a lot of sugar and must be counted as carbs. What are tips for following this plan?  Reading food labels  Start by checking the serving size on the "Nutrition Facts" label of packaged foods and drinks. The amount of calories, carbs, fats, and other  nutrients listed on the label is based on one serving of the item. Many items contain more than one serving per package.  Check the total grams (g) of carbs in one serving. You can calculate the number of servings of carbs in one serving by dividing the total carbs by 15. For example, if a food has 30 g of total carbs, it would be equal to 2 servings of carbs.  Check the number of grams (g) of saturated and trans fats in one serving. Choose foods that have low or no amount of these fats.  Check the number of milligrams (mg) of salt (sodium) in one serving. Most people should limit total sodium intake to less than 2,300 mg per day.  Always check the nutrition information of foods labeled as "low-fat" or "nonfat". These foods may be higher in added sugar or refined carbs and should be avoided.  Talk to your dietitian to identify your daily goals for nutrients listed on the label. Shopping  Avoid buying canned, premade, or processed foods. These foods tend to be high in fat, sodium, and added sugar.  Shop around the outside edge of the grocery store. This includes fresh fruits and vegetables, bulk grains, fresh meats, and fresh dairy. Cooking  Use low-heat cooking methods, such as baking, instead of high-heat cooking methods like deep frying.  Cook using healthy oils, such as olive, canola, or sunflower oil.  Avoid cooking with butter, cream, or high-fat meats. Meal planning  Eat meals and snacks regularly, preferably at the same times every day. Avoid going long periods of time without eating.  Eat foods high in fiber, such as fresh fruits, vegetables, beans, and whole grains. Talk to your dietitian about how many servings of carbs you can eat at each meal.  Eat 4-6 ounces (oz) of lean protein each day, such as lean meat, chicken, fish, eggs, or tofu. One oz of lean protein is equal to: ? 1 oz of meat, chicken, or fish. ? 1 egg. ?  cup of tofu.  Eat some foods each day that contain  healthy fats, such as avocado, nuts, seeds, and fish. Lifestyle  Check your blood glucose regularly.  Exercise regularly as told by your health care provider. This may include: ? 150 minutes of moderate-intensity or vigorous-intensity exercise each week. This could be brisk walking, biking, or water aerobics. ? Stretching and doing strength exercises, such as yoga or weightlifting, at least 2 times a week.  Take medicines as told by your health care provider.  Do not use any products that contain nicotine or tobacco, such as cigarettes and e-cigarettes. If you need help quitting, ask your health care provider.  Work with a counselor or diabetes educator to identify strategies to manage stress and any emotional and social challenges. Questions to ask a health care provider  Do I need to meet with a diabetes educator?  Do I need to meet with a dietitian?  What number can I call if I have questions?  When are the best times to   check my blood glucose? Where to find more information:  American Diabetes Association: diabetes.org  Academy of Nutrition and Dietetics: www.eatright.org  National Institute of Diabetes and Digestive and Kidney Diseases (NIH): www.niddk.nih.gov Summary  A healthy meal plan will help you control your blood glucose and maintain a healthy lifestyle.  Working with a diet and nutrition specialist (dietitian) can help you make a meal plan that is best for you.  Keep in mind that carbohydrates (carbs) and alcohol have immediate effects on your blood glucose levels. It is important to count carbs and to use alcohol carefully. This information is not intended to replace advice given to you by your health care provider. Make sure you discuss any questions you have with your health care provider. Document Revised: 06/28/2017 Document Reviewed: 08/20/2016 Elsevier Patient Education  2020 [employer]  Hypertension, Adult High blood pressure (hypertension) is when  the force of blood pumping through the arteries is too strong. The arteries are the blood vessels that carry blood from the heart throughout the body. Hypertension forces the heart to work harder to pump blood and may cause arteries to become narrow or stiff. Untreated or uncontrolled hypertension can cause a heart attack, heart failure, a stroke, kidney disease, and other problems. A blood pressure reading consists of a higher number over a lower number. Ideally, your blood pressure should be below 120/80. The first ("top") number is called the systolic pressure. It is a measure of the pressure in your arteries as your heart beats. The second ("bottom") number is called the diastolic pressure. It is a measure of the pressure in your arteries as the heart relaxes. What are the causes? The exact cause of this condition is not known. There are some conditions that result in or are related to high blood pressure. What increases the risk? Some risk factors for high blood pressure are under your control. The following factors may make you more likely to develop this condition:  Smoking.  Having type 2 diabetes mellitus, high cholesterol, or both.  Not getting enough exercise or physical activity.  Being overweight.  Having too much fat, sugar, calories, or salt (sodium) in your diet.  Drinking too much alcohol. Some risk factors for high blood pressure may be difficult or impossible to change. Some of these factors include:  Having chronic kidney disease.  Having a family history of high blood pressure.  Age. Risk increases with age.  Race. You may be at higher risk if you are African American.  Gender. Men are at higher risk than women before age 45. After age 65, women are at higher risk than men.  Having obstructive sleep apnea.  Stress. What are the signs or symptoms? High blood pressure may not cause symptoms. Very high blood pressure (hypertensive crisis) may  cause:  Headache.  Anxiety.  Shortness of breath.  Nosebleed.  Nausea and vomiting.  Vision changes.  Severe chest pain.  Seizures. How is this diagnosed? This condition is diagnosed by measuring your blood pressure while you are seated, with your arm resting on a flat surface, your legs uncrossed, and your feet flat on the floor. The cuff of the blood pressure monitor will be placed directly against the skin of your upper arm at the level of your heart. It should be measured at least twice using the same arm. Certain conditions can cause a difference in blood pressure between your right and left arms. Certain factors can cause blood pressure readings to be lower or higher   than normal for a short period of time:  When your blood pressure is higher when you are in a health care provider's office than when you are at home, this is called white coat hypertension. Most people with this condition do not need medicines.  When your blood pressure is higher at home than when you are in a health care provider's office, this is called masked hypertension. Most people with this condition may need medicines to control blood pressure. If you have a high blood pressure reading during one visit or you have normal blood pressure with other risk factors, you may be asked to:  Return on a different day to have your blood pressure checked again.  Monitor your blood pressure at home for 1 week or longer. If you are diagnosed with hypertension, you may have other blood or imaging tests to help your health care provider understand your overall risk for other conditions. How is this treated? This condition is treated by making healthy lifestyle changes, such as eating healthy foods, exercising more, and reducing your alcohol intake. Your health care provider may prescribe medicine if lifestyle changes are not enough to get your blood pressure under control, and if:  Your systolic blood pressure is above  130.  Your diastolic blood pressure is above 80. Your personal target blood pressure may vary depending on your medical conditions, your age, and other factors. Follow these instructions at home: Eating and drinking   Eat a diet that is high in fiber and potassium, and low in sodium, added sugar, and fat. An example eating plan is called the DASH (Dietary Approaches to Stop Hypertension) diet. To eat this way: ? Eat plenty of fresh fruits and vegetables. Try to fill one half of your plate at each meal with fruits and vegetables. ? Eat whole grains, such as whole-wheat pasta, brown rice, or whole-grain bread. Fill about one fourth of your plate with whole grains. ? Eat or drink low-fat dairy products, such as skim milk or low-fat yogurt. ? Avoid fatty cuts of meat, processed or cured meats, and poultry with skin. Fill about one fourth of your plate with lean proteins, such as fish, chicken without skin, beans, eggs, or tofu. ? Avoid pre-made and processed foods. These tend to be higher in sodium, added sugar, and fat.  Reduce your daily sodium intake. Most people with hypertension should eat less than 1,500 mg of sodium a day.  Do not drink alcohol if: ? Your health care provider tells you not to drink. ? You are pregnant, may be pregnant, or are planning to become pregnant.  If you drink alcohol: ? Limit how much you use to:  0-1 drink a day for women.  0-2 drinks a day for men. ? Be aware of how much alcohol is in your drink. In the [place] one drink equals one 12 oz bottle of beer (355 mL), one 5 oz glass of wine (148 mL), or one 1 oz glass of hard liquor (44 mL). Lifestyle   Work with your health care provider to maintain a healthy body weight or to lose weight. Ask what an ideal weight is for you.  Get at least 30 minutes of exercise most days of the week. Activities may include walking, swimming, or biking.  Include exercise to strengthen your muscles (resistance exercise),  such as Pilates or lifting weights, as part of your weekly exercise routine. Try to do these types of exercises for 30 minutes at least 3 days a   week.  Do not use any products that contain nicotine or tobacco, such as cigarettes, e-cigarettes, and chewing tobacco. If you need help quitting, ask your health care provider.  Monitor your blood pressure at home as told by your health care provider.  Keep all follow-up visits as told by your health care provider. This is important. Medicines  Take over-the-counter and prescription medicines only as told by your health care provider. Follow directions carefully. Blood pressure medicines must be taken as prescribed.  Do not skip doses of blood pressure medicine. Doing this puts you at risk for problems and can make the medicine less effective.  Ask your health care provider about side effects or reactions to medicines that you should watch for. Contact a health care provider if you:  Think you are having a reaction to a medicine you are taking.  Have headaches that keep coming back (recurring).  Feel dizzy.  Have swelling in your ankles.  Have trouble with your vision. Get help right away if you:  Develop a severe headache or confusion.  Have unusual weakness or numbness.  Feel faint.  Have severe pain in your chest or abdomen.  Vomit repeatedly.  Have trouble breathing. Summary  Hypertension is when the force of blood pumping through your arteries is too strong. If this condition is not controlled, it may put you at risk for serious complications.  Your personal target blood pressure may vary depending on your medical conditions, your age, and other factors. For most people, a normal blood pressure is less than 120/80.  Hypertension is treated with lifestyle changes, medicines, or a combination of both. Lifestyle changes include losing weight, eating a healthy, low-sodium diet, exercising more, and limiting alcohol. This  information is not intended to replace advice given to you by your health care provider. Make sure you discuss any questions you have with your health care provider. Document Revised: 03/26/2018 Document Reviewed: 03/26/2018 Elsevier Patient Education  2020 [employer]  

## 2019-09-29 NOTE — Progress Notes (Signed)
Billy Rios 54 y.o.   Chief Complaint  Patient presents with  . Diabetes    follow up 4 weeks    HISTORY OF PRESENT ILLNESS: This is a 54 y.o. male with history of diabetes here for follow-up.  Doing well. Glucose readings at home within normal limits.  Presently taking Metformin 1000 mg twice a day and glipizide 5 mg twice a day. Has improved his nutrition and physical activity. Wt Readings from Last 3 Encounters:  09/29/19 208 lb (94.3 kg)  09/01/19 210 lb (95.3 kg)  08/30/19 210 lb (95.3 kg)   Doing well.  Has no complaints today. Lab Results  Component Value Date   HGBA1C 12.2 (A) 09/01/2019   HPI   Prior to Admission medications   Medication Sig Start Date End Date Taking? Authorizing Provider  glipiZIDE (GLUCOTROL) 5 MG tablet Take 1 tablet (5 mg total) by mouth 2 (two) times daily with a meal. 09/01/19 11/30/19 Yes Aundra Pung, [name], MD  metFORMIN (GLUCOPHAGE) 1000 MG tablet Take 1 tablet (1,000 mg total) by mouth 2 (two) times daily with a meal. 09/01/19  Yes Marcelene Weidemann, [name], MD  rosuvastatin (CRESTOR) 10 MG tablet Take 1 tablet (10 mg total) by mouth daily. 09/01/19  Yes [name], MD    Allergies  Allergen Reactions  . Iohexol Hives    Patient Active Problem List   Diagnosis Date Noted  . Type 2 diabetes mellitus without complication, without long-term current use of insulin ([place]) 07/01/2018    Past Medical History:  Diagnosis Date  . Diabetes mellitus without complication ([place])   . Hyperlipidemia   . Mandible open fracture [facility])     Past Surgical History:  Procedure Laterality Date  . FACIAL FRACTURE SURGERY    . KNEE ARTHROSCOPY Left     Social History   Socioeconomic History  . Marital status: Married    Spouse name: Not on file  . Number of children: Not on file  . Years of education: Not on file  . Highest education level: Not on file  Occupational History  . Not on file  Tobacco Use  . Smoking status:  Never Smoker  . Smokeless tobacco: Never Used  Substance and Sexual Activity  . Alcohol use: No  . Drug use: No  . Sexual activity: Yes    Birth control/protection: None  Other Topics Concern  . Not on file  Social History Narrative  . Not on file   Social Determinants of Health   Financial Resource Strain:   . Difficulty of Paying Living Expenses: Not on file  Food Insecurity:   . Worried About Charity fundraiser in the Last Year: Not on file  . Ran Out of Food in the Last Year: Not on file  Transportation Needs:   . Lack of Transportation (Medical): Not on file  . Lack of Transportation (Non-Medical): Not on file  Physical Activity:   . Days of Exercise per Week: Not on file  . Minutes of Exercise per Session: Not on file  Stress:   . Feeling of Stress : Not on file  Social Connections:   . Frequency of Communication with Friends and Family: Not on file  . Frequency of Social Gatherings with Friends and Family: Not on file  . Attends Religious Services: Not on file  . Active Member of Clubs or Organizations: Not on file  . Attends Archivist Meetings: Not on file  . Marital Status: Not on file  Intimate Partner Violence:   . Fear of Current or Ex-Partner: Not on file  . Emotionally Abused: Not on file  . Physically Abused: Not on file  . Sexually Abused: Not on file    Family History  Problem Relation Age of Onset  . Diabetes Father   . Hypertension Father   . Diabetes Paternal Grandmother   . Cancer Paternal Grandmother   . Diabetes Brother   . Hypertension Brother   . Colon cancer Neg Hx   . Colon polyps Neg Hx   . Esophageal cancer Neg Hx   . Rectal cancer Neg Hx   . Stomach cancer Neg Hx      Review of Systems  Constitutional: Negative.  Negative for chills and fever.  HENT: Negative.  Negative for congestion and sore throat.   Respiratory: Negative.  Negative for cough and shortness of breath.   Cardiovascular: Negative.  Negative for  chest pain and palpitations.  Gastrointestinal: Negative.  Negative for abdominal pain, blood in stool, diarrhea, melena, nausea and vomiting.  Genitourinary: Negative.  Negative for dysuria and hematuria.  Musculoskeletal: Negative.  Negative for back pain, myalgias and neck pain.  Skin: Negative.  Negative for rash.  Neurological: Negative for dizziness and headaches.  All other systems reviewed and are negative.  Today's Vitals   09/29/19 1105  BP: 114/77  Pulse: 77  Resp: 16  Temp: 97.7 F (36.5 C)  TempSrc: Temporal  SpO2: 96%  Weight: 208 lb (94.3 kg)  Height: 5' 9.5" (1.765 m)   Body mass index is 30.28 kg/m.   Physical Exam Vitals reviewed.  Constitutional:      Appearance: Normal appearance.  HENT:     Head: Normocephalic.  Eyes:     Extraocular Movements: Extraocular movements intact.     Pupils: Pupils are equal, round, and reactive to light.  Cardiovascular:     Rate and Rhythm: Normal rate and regular rhythm.     Pulses: Normal pulses.     Heart sounds: Normal heart sounds.  Pulmonary:     Effort: Pulmonary effort is normal.     Breath sounds: Normal breath sounds.  Musculoskeletal:        General: Normal range of motion.     Cervical back: Normal range of motion and neck supple.  Skin:    General: Skin is warm and dry.     Capillary Refill: Capillary refill takes less than 2 seconds.  Neurological:     General: No focal deficit present.     Mental Status: He is alert and oriented to person, place, and time.  Psychiatric:        Mood and Affect: Mood normal.        Behavior: Behavior normal.    Results for orders placed or performed in visit on 09/29/19 (from the past 24 hour(s))  POCT glucose (manual entry)     Status: Abnormal   Collection Time: 09/29/19 11:40 AM  Result Value Ref Range   POC Glucose 113 (A) 70 - 99 mg/dl  POCT glycosylated hemoglobin (Hb A1C)     Status: Abnormal   Collection Time: 09/29/19 11:47 AM  Result Value Ref Range     Hemoglobin A1C 10.6 (A) 4.0 - 5.6 %   HbA1c POC (<> result, manual entry)     HbA1c, POC (prediabetic range)     HbA1c, POC (controlled diabetic range)     A total of 30 minutes was spent with the patient, greater than 50% of  which was in counseling/coordination of care regarding diabetes and management including diet, nutrition, and medications, review of most recent office visit notes, review of most recent blood work results including today's hemoglobin A1c and glucose, cardiovascular risks associated with diabetes and need for statin therapy, prognosis, and need for follow-up in 3 months.   ASSESSMENT & PLAN: Type 2 diabetes mellitus without complication, without long-term current use of insulin (HCC) Much improved.  Numbers improving.  Continue present medications, diet and nutrition, and physical exercise. Follow-up in 3 months. Continue statin therapy.   Kedwin was seen today for diabetes.  Diagnoses and all orders for this visit:  Type 2 diabetes mellitus with hyperglycemia, without long-term current use of insulin (HCC) -     POCT glucose (manual entry) -     Lipid panel -     Microalbumin, urine -     POCT glycosylated hemoglobin (Hb A1C)    Patient Instructions       If you have lab work done today you will be contacted with your lab results within the next 2 weeks.  If you have not heard from [place] then please contact us. The fastest way to get your results is to register for My Chart.   IF you received an x-ray today, you will receive an invoice from [facility]. Please contact [facility] at [phone] with questions or concerns regarding your invoice.   IF you received labwork today, you will receive an invoice from [facility]. Please contact [facility] at [phone] with questions or concerns regarding your invoice.   Our billing staff will not be able to assist you with questions regarding bills from these companies.  You will be contacted  with the lab results as soon as they are available. The fastest way to get your results is to activate your My Chart account. Instructions are located on the last page of this paperwork. If you have not heard from [place] regarding the results in 2 weeks, please contact this office.     Diabetes Mellitus and Nutrition, Adult When you have diabetes (diabetes mellitus), it is very important to have healthy eating habits because your blood sugar (glucose) levels are greatly affected by what you eat and drink. Eating healthy foods in the appropriate amounts, at about the same times every day, can help you:  Control your blood glucose.  Lower your risk of heart disease.  Improve your blood pressure.  Reach or maintain a healthy weight. Every person with diabetes is different, and each person has different needs for a meal plan. Your health care provider may recommend that you work with a diet and nutrition specialist (dietitian) to make a meal plan that is best for you. Your meal plan may vary depending on factors such as:  The calories you need.  The medicines you take.  Your weight.  Your blood glucose, blood pressure, and cholesterol levels.  Your activity level.  Other health conditions you have, such as heart or kidney disease. How do carbohydrates affect me? Carbohydrates, also called carbs, affect your blood glucose level more than any other type of food. Eating carbs naturally raises the amount of glucose in your blood. Carb counting is a method for keeping track of how many carbs you eat. Counting carbs is important to keep your blood glucose at a healthy level, especially if you use insulin or take certain oral diabetes medicines. It is important to know how many carbs you can safely have in each meal. This is  different for every person. Your dietitian can help you calculate how many carbs you should have at each meal and for each snack. Foods that contain carbs include:  Bread,  cereal, rice, pasta, and crackers.  Potatoes and corn.  Peas, beans, and lentils.  Milk and yogurt.  Fruit and juice.  Desserts, such as cakes, cookies, ice cream, and candy. How does alcohol affect me? Alcohol can cause a sudden decrease in blood glucose (hypoglycemia), especially if you use insulin or take certain oral diabetes medicines. Hypoglycemia can be a life-threatening condition. Symptoms of hypoglycemia (sleepiness, dizziness, and confusion) are similar to symptoms of having too much alcohol. If your health care provider says that alcohol is safe for you, follow these guidelines:  Limit alcohol intake to no more than 1 drink per day for nonpregnant women and 2 drinks per day for men. One drink equals 12 oz of beer, 5 oz of wine, or 1 oz of hard liquor.  Do not drink on an empty stomach.  Keep yourself hydrated with water, diet soda, or unsweetened iced tea.  Keep in mind that regular soda, juice, and other mixers may contain a lot of sugar and must be counted as carbs. What are tips for following this plan?  Reading food labels  Start by checking the serving size on the "Nutrition Facts" label of packaged foods and drinks. The amount of calories, carbs, fats, and other nutrients listed on the label is based on one serving of the item. Many items contain more than one serving per package.  Check the total grams (g) of carbs in one serving. You can calculate the number of servings of carbs in one serving by dividing the total carbs by 15. For example, if a food has 30 g of total carbs, it would be equal to 2 servings of carbs.  Check the number of grams (g) of saturated and trans fats in one serving. Choose foods that have low or no amount of these fats.  Check the number of milligrams (mg) of salt (sodium) in one serving. Most people should limit total sodium intake to less than 2,300 mg per day.  Always check the nutrition information of foods labeled as "low-fat" or  "nonfat". These foods may be higher in added sugar or refined carbs and should be avoided.  Talk to your dietitian to identify your daily goals for nutrients listed on the label. Shopping  Avoid buying canned, premade, or processed foods. These foods tend to be high in fat, sodium, and added sugar.  Shop around the outside edge of the grocery store. This includes fresh fruits and vegetables, bulk grains, fresh meats, and fresh dairy. Cooking  Use low-heat cooking methods, such as baking, instead of high-heat cooking methods like deep frying.  Cook using healthy oils, such as olive, canola, or sunflower oil.  Avoid cooking with butter, cream, or high-fat meats. Meal planning  Eat meals and snacks regularly, preferably at the same times every day. Avoid going long periods of time without eating.  Eat foods high in fiber, such as fresh fruits, vegetables, beans, and whole grains. Talk to your dietitian about how many servings of carbs you can eat at each meal.  Eat 4-6 ounces (oz) of lean protein each day, such as lean meat, chicken, fish, eggs, or tofu. One oz of lean protein is equal to: ? 1 oz of meat, chicken, or fish. ? 1 egg. ?  cup of tofu.  Eat some foods each day that  contain healthy fats, such as avocado, nuts, seeds, and fish. Lifestyle  Check your blood glucose regularly.  Exercise regularly as told by your health care provider. This may include: ? 150 minutes of moderate-intensity or vigorous-intensity exercise each week. This could be brisk walking, biking, or water aerobics. ? Stretching and doing strength exercises, such as yoga or weightlifting, at least 2 times a week.  Take medicines as told by your health care provider.  Do not use any products that contain nicotine or tobacco, such as cigarettes and e-cigarettes. If you need help quitting, ask your health care provider.  Work with a Social worker or diabetes educator to identify strategies to manage stress and  any emotional and social challenges. Questions to ask a health care provider  Do I need to meet with a diabetes educator?  Do I need to meet with a dietitian?  What number can I call if I have questions?  When are the best times to check my blood glucose? Where to find more information:  American Diabetes Association: diabetes.org  Academy of Nutrition and Dietetics: www.eatright.[employer] of Diabetes and Digestive and Kidney Diseases (NIH): [employer] Summary  A healthy meal plan will help you control your blood glucose and maintain a healthy lifestyle.  Working with a diet and nutrition specialist (dietitian) can help you make a meal plan that is best for you.  Keep in mind that carbohydrates (carbs) and alcohol have immediate effects on your blood glucose levels. It is important to count carbs and to use alcohol carefully. This information is not intended to replace advice given to you by your health care provider. Make sure you discuss any questions you have with your health care provider. Document Revised: 06/28/2017 Document Reviewed: 08/20/2016 Elsevier Patient Education  [place].  Hypertension, Adult High blood pressure (hypertension) is when the force of blood pumping through the arteries is too strong. The arteries are the blood vessels that carry blood from the heart throughout the body. Hypertension forces the heart to work harder to pump blood and may cause arteries to become narrow or stiff. Untreated or uncontrolled hypertension can cause a heart attack, heart failure, a stroke, kidney disease, and other problems. A blood pressure reading consists of a higher number over a lower number. Ideally, your blood pressure should be below 120/80. The first ("top") number is called the systolic pressure. It is a measure of the pressure in your arteries as your heart beats. The second ("bottom") number is called the diastolic pressure. It is a  measure of the pressure in your arteries as the heart relaxes. What are the causes? The exact cause of this condition is not known. There are some conditions that result in or are related to high blood pressure. What increases the risk? Some risk factors for high blood pressure are under your control. The following factors may make you more likely to develop this condition:  Smoking.  Having type 2 diabetes mellitus, high cholesterol, or both.  Not getting enough exercise or physical activity.  Being overweight.  Having too much fat, sugar, calories, or salt (sodium) in your diet.  Drinking too much alcohol. Some risk factors for high blood pressure may be difficult or impossible to change. Some of these factors include:  Having chronic kidney disease.  Having a family history of high blood pressure.  Age. Risk increases with age.  Race. You may be at higher risk if you are African American.  Gender. Men are at  higher risk than women before age 72. After age 64, women are at higher risk than men.  Having obstructive sleep apnea.  Stress. What are the signs or symptoms? High blood pressure may not cause symptoms. Very high blood pressure (hypertensive crisis) may cause:  Headache.  Anxiety.  Shortness of breath.  Nosebleed.  Nausea and vomiting.  Vision changes.  Severe chest pain.  Seizures. How is this diagnosed? This condition is diagnosed by measuring your blood pressure while you are seated, with your arm resting on a flat surface, your legs uncrossed, and your feet flat on the floor. The cuff of the blood pressure monitor will be placed directly against the skin of your upper arm at the level of your heart. It should be measured at least twice using the same arm. Certain conditions can cause a difference in blood pressure between your right and left arms. Certain factors can cause blood pressure readings to be lower or higher than normal for a short period of  time:  When your blood pressure is higher when you are in a health care provider's office than when you are at home, this is called white coat hypertension. Most people with this condition do not need medicines.  When your blood pressure is higher at home than when you are in a health care provider's office, this is called masked hypertension. Most people with this condition may need medicines to control blood pressure. If you have a high blood pressure reading during one visit or you have normal blood pressure with other risk factors, you may be asked to:  Return on a different day to have your blood pressure checked again.  Monitor your blood pressure at home for 1 week or longer. If you are diagnosed with hypertension, you may have other blood or imaging tests to help your health care provider understand your overall risk for other conditions. How is this treated? This condition is treated by making healthy lifestyle changes, such as eating healthy foods, exercising more, and reducing your alcohol intake. Your health care provider may prescribe medicine if lifestyle changes are not enough to get your blood pressure under control, and if:  Your systolic blood pressure is above 130.  Your diastolic blood pressure is above 80. Your personal target blood pressure may vary depending on your medical conditions, your age, and other factors. Follow these instructions at home: Eating and drinking   Eat a diet that is high in fiber and potassium, and low in sodium, added sugar, and fat. An example eating plan is called the DASH (Dietary Approaches to Stop Hypertension) diet. To eat this way: ? Eat plenty of fresh fruits and vegetables. Try to fill one half of your plate at each meal with fruits and vegetables. ? Eat whole grains, such as whole-wheat pasta, brown rice, or whole-grain bread. Fill about one fourth of your plate with whole grains. ? Eat or drink low-fat dairy products, such as skim  milk or low-fat yogurt. ? Avoid fatty cuts of meat, processed or cured meats, and poultry with skin. Fill about one fourth of your plate with lean proteins, such as fish, chicken without skin, beans, eggs, or tofu. ? Avoid pre-made and processed foods. These tend to be higher in sodium, added sugar, and fat.  Reduce your daily sodium intake. Most people with hypertension should eat less than 1,500 mg of sodium a day.  Do not drink alcohol if: ? Your health care provider tells you not to drink. ?  You are pregnant, may be pregnant, or are planning to become pregnant.  If you drink alcohol: ? Limit how much you use to:  0-1 drink a day for women.  0-2 drinks a day for men. ? Be aware of how much alcohol is in your drink. In the [place] one drink equals one 12 oz bottle of beer (355 mL), one 5 oz glass of wine (148 mL), or one 1 oz glass of hard liquor (44 mL). Lifestyle   Work with your health care provider to maintain a healthy body weight or to lose weight. Ask what an ideal weight is for you.  Get at least 30 minutes of exercise most days of the week. Activities may include walking, swimming, or biking.  Include exercise to strengthen your muscles (resistance exercise), such as Pilates or lifting weights, as part of your weekly exercise routine. Try to do these types of exercises for 30 minutes at least 3 days a week.  Do not use any products that contain nicotine or tobacco, such as cigarettes, e-cigarettes, and chewing tobacco. If you need help quitting, ask your health care provider.  Monitor your blood pressure at home as told by your health care provider.  Keep all follow-up visits as told by your health care provider. This is important. Medicines  Take over-the-counter and prescription medicines only as told by your health care provider. Follow directions carefully. Blood pressure medicines must be taken as prescribed.  Do not skip doses of blood pressure medicine. Doing this  puts you at risk for problems and can make the medicine less effective.  Ask your health care provider about side effects or reactions to medicines that you should watch for. Contact a health care provider if you:  Think you are having a reaction to a medicine you are taking.  Have headaches that keep coming back (recurring).  Feel dizzy.  Have swelling in your ankles.  Have trouble with your vision. Get help right away if you:  Develop a severe headache or confusion.  Have unusual weakness or numbness.  Feel faint.  Have severe pain in your chest or abdomen.  Vomit repeatedly.  Have trouble breathing. Summary  Hypertension is when the force of blood pumping through your arteries is too strong. If this condition is not controlled, it may put you at risk for serious complications.  Your personal target blood pressure may vary depending on your medical conditions, your age, and other factors. For most people, a normal blood pressure is less than 120/80.  Hypertension is treated with lifestyle changes, medicines, or a combination of both. Lifestyle changes include losing weight, eating a healthy, low-sodium diet, exercising more, and limiting alcohol. This information is not intended to replace advice given to you by your health care provider. Make sure you discuss any questions you have with your health care provider. Document Revised: 03/26/2018 Document Reviewed: 03/26/2018 Elsevier Patient Education  2020 Elsevier Inc.     Agustina Caroli, MD [facility]

## 2019-09-30 LAB — LIPID PANEL
Chol/HDL Ratio: 3.6 ratio (ref 0.0–5.0)
Cholesterol, Total: 112 mg/dL (ref 100–199)
HDL: 31 mg/dL — ABNORMAL LOW (ref >39)
LDL Chol Calc (NIH): 69 mg/dL (ref 0–99)
Triglycerides: 53 mg/dL (ref 0–149)
VLDL Cholesterol Cal: 12 mg/dL (ref 5–40)

## 2019-09-30 LAB — MICROALBUMIN, URINE: Microalbumin, Urine: 11.1 ug/mL

## 2019-11-05 LAB — HM DIABETES EYE EXAM

## 2019-11-16 ENCOUNTER — Encounter: Payer: Self-pay | Admitting: *Deleted

## 2019-11-16 ENCOUNTER — Telehealth: Payer: Self-pay | Admitting: *Deleted

## 2019-11-17 NOTE — Telephone Encounter (Signed)
error 

## 2019-12-18 ENCOUNTER — Ambulatory Visit: Payer: 59 | Attending: Internal Medicine

## 2019-12-18 DIAGNOSIS — Z23 Encounter for immunization: Secondary | ICD-10-CM

## 2019-12-18 NOTE — Progress Notes (Signed)
   Covid-19 Vaccination Clinic  Name:  Billy Rios    MRN: [id number] DOB: August 04, 1965  12/18/2019  Billy Rios was observed post Covid-19 immunization for 15 minutes without incident. He was provided with Vaccine Information Sheet and instruction to access the V-Safe system.   Billy Rios was instructed to call 911 with any severe reactions post vaccine: Marland Kitchen Difficulty breathing  . Swelling of face and throat  . A fast heartbeat  . A bad rash all over body  . Dizziness and weakness   Immunizations Administered    Name Date Dose VIS Date Route   Pfizer COVID-19 Vaccine 12/18/2019  9:50 AM 0.3 mL 09/23/2018 Intramuscular   Manufacturer: [place]   Lot: [id number]   [place]: [id number]

## 2019-12-29 ENCOUNTER — Encounter: Payer: Self-pay | Admitting: Emergency Medicine

## 2019-12-29 ENCOUNTER — Ambulatory Visit: Payer: 59 | Admitting: Emergency Medicine

## 2019-12-29 ENCOUNTER — Other Ambulatory Visit: Payer: Self-pay

## 2019-12-29 VITALS — BP 126/84 | HR 78 | Temp 97.6°F | Ht 69.5 in | Wt 210.0 lb

## 2019-12-29 DIAGNOSIS — E1165 Type 2 diabetes mellitus with hyperglycemia: Secondary | ICD-10-CM | POA: Diagnosis not present

## 2019-12-29 LAB — POCT GLYCOSYLATED HEMOGLOBIN (HGB A1C): Hemoglobin A1C: 6.3 % — AB (ref 4.0–5.6)

## 2019-12-29 NOTE — Patient Instructions (Addendum)
   If you have lab work done today you will be contacted with your lab results within the next 2 weeks.  If you have not heard from us then please contact us. The fastest way to get your results is to register for My Chart.   IF you received an x-ray today, you will receive an invoice from [facility]. Please contact  Radiology at [phone] with questions or concerns regarding your invoice.   IF you received labwork today, you will receive an invoice from [facility]. Please contact [facility] at [phone] with questions or concerns regarding your invoice.   Our billing staff will not be able to assist you with questions regarding bills from these companies.  You will be contacted with the lab results as soon as they are available. The fastest way to get your results is to activate your My Chart account. Instructions are located on the last page of this paperwork. If you have not heard from us regarding the results in 2 weeks, please contact this office.     Diabetes Mellitus and Nutrition, Adult When you have diabetes (diabetes mellitus), it is very important to have healthy eating habits because your blood sugar (glucose) levels are greatly affected by what you eat and drink. Eating healthy foods in the appropriate amounts, at about the same times every day, can help you:  Control your blood glucose.  Lower your risk of heart disease.  Improve your blood pressure.  Reach or maintain a healthy weight. Every person with diabetes is different, and each person has different needs for a meal plan. Your health care provider may recommend that you work with a diet and nutrition specialist (dietitian) to make a meal plan that is best for you. Your meal plan may vary depending on factors such as:  The calories you need.  The medicines you take.  Your weight.  Your blood glucose, blood pressure, and cholesterol levels.  Your activity level.  Other health conditions  you have, such as heart or kidney disease. How do carbohydrates affect me? Carbohydrates, also called carbs, affect your blood glucose level more than any other type of food. Eating carbs naturally raises the amount of glucose in your blood. Carb counting is a method for keeping track of how many carbs you eat. Counting carbs is important to keep your blood glucose at a healthy level, especially if you use insulin or take certain oral diabetes medicines. It is important to know how many carbs you can safely have in each meal. This is different for every person. Your dietitian can help you calculate how many carbs you should have at each meal and for each snack. Foods that contain carbs include:  Bread, cereal, rice, pasta, and crackers.  Potatoes and corn.  Peas, beans, and lentils.  Milk and yogurt.  Fruit and juice.  Desserts, such as cakes, cookies, ice cream, and candy. How does alcohol affect me? Alcohol can cause a sudden decrease in blood glucose (hypoglycemia), especially if you use insulin or take certain oral diabetes medicines. Hypoglycemia can be a life-threatening condition. Symptoms of hypoglycemia (sleepiness, dizziness, and confusion) are similar to symptoms of having too much alcohol. If your health care provider says that alcohol is safe for you, follow these guidelines:  Limit alcohol intake to no more than 1 drink per day for nonpregnant women and 2 drinks per day for men. One drink equals 12 oz of beer, 5 oz of wine, or 1 oz of hard liquor.    Do not drink on an empty stomach.  Keep yourself hydrated with water, diet soda, or unsweetened iced tea.  Keep in mind that regular soda, juice, and other mixers may contain a lot of sugar and must be counted as carbs. What are tips for following this plan?  Reading food labels  Start by checking the serving size on the "Nutrition Facts" label of packaged foods and drinks. The amount of calories, carbs, fats, and other  nutrients listed on the label is based on one serving of the item. Many items contain more than one serving per package.  Check the total grams (g) of carbs in one serving. You can calculate the number of servings of carbs in one serving by dividing the total carbs by 15. For example, if a food has 30 g of total carbs, it would be equal to 2 servings of carbs.  Check the number of grams (g) of saturated and trans fats in one serving. Choose foods that have low or no amount of these fats.  Check the number of milligrams (mg) of salt (sodium) in one serving. Most people should limit total sodium intake to less than 2,300 mg per day.  Always check the nutrition information of foods labeled as "low-fat" or "nonfat". These foods may be higher in added sugar or refined carbs and should be avoided.  Talk to your dietitian to identify your daily goals for nutrients listed on the label. Shopping  Avoid buying canned, premade, or processed foods. These foods tend to be high in fat, sodium, and added sugar.  Shop around the outside edge of the grocery store. This includes fresh fruits and vegetables, bulk grains, fresh meats, and fresh dairy. Cooking  Use low-heat cooking methods, such as baking, instead of high-heat cooking methods like deep frying.  Cook using healthy oils, such as olive, canola, or sunflower oil.  Avoid cooking with butter, cream, or high-fat meats. Meal planning  Eat meals and snacks regularly, preferably at the same times every day. Avoid going long periods of time without eating.  Eat foods high in fiber, such as fresh fruits, vegetables, beans, and whole grains. Talk to your dietitian about how many servings of carbs you can eat at each meal.  Eat 4-6 ounces (oz) of lean protein each day, such as lean meat, chicken, fish, eggs, or tofu. One oz of lean protein is equal to: ? 1 oz of meat, chicken, or fish. ? 1 egg. ?  cup of tofu.  Eat some foods each day that contain  healthy fats, such as avocado, nuts, seeds, and fish. Lifestyle  Check your blood glucose regularly.  Exercise regularly as told by your health care provider. This may include: ? 150 minutes of moderate-intensity or vigorous-intensity exercise each week. This could be brisk walking, biking, or water aerobics. ? Stretching and doing strength exercises, such as yoga or weightlifting, at least 2 times a week.  Take medicines as told by your health care provider.  Do not use any products that contain nicotine or tobacco, such as cigarettes and e-cigarettes. If you need help quitting, ask your health care provider.  Work with a counselor or diabetes educator to identify strategies to manage stress and any emotional and social challenges. Questions to ask a health care provider  Do I need to meet with a diabetes educator?  Do I need to meet with a dietitian?  What number can I call if I have questions?  When are the best times to   times to check my blood glucose? Where to find more information:  American Diabetes Association: diabetes.org  Academy of Nutrition and Dietetics: www.eatright.org  National Institute of Diabetes and Digestive and Kidney Diseases (NIH): www.niddk.nih.gov Summary  A healthy meal plan will help you control your blood glucose and maintain a healthy lifestyle.  Working with a diet and nutrition specialist (dietitian) can help you make a meal plan that is best for you.  Keep in mind that carbohydrates (carbs) and alcohol have immediate effects on your blood glucose levels. It is important to count carbs and to use alcohol carefully. This information is not intended to replace advice given to you by your health care provider. Make sure you discuss any questions you have with your health care provider. Document Revised: 06/28/2017 Document Reviewed: 08/20/2016 Elsevier Patient Education  2020 [employer]  

## 2019-12-29 NOTE — Assessment & Plan Note (Signed)
Much improved diabetes with hemoglobin A1c of 6.3.  Continue glipizide and Metformin.  No changes.  Diet and nutrition discussed. Follow-up in 6 months.

## 2019-12-29 NOTE — Progress Notes (Signed)
Billy Rios 54 y.o.   Chief Complaint  Patient presents with   Diabetes    HISTORY OF PRESENT ILLNESS: This is a 54 y.o. male with history of diabetes here for follow-up.  Blood sugars at home within normal limits. Presently taking glipizide 5 mg twice a day and Metformin 1000 mg twice a day.  Also taking Crestor 10 mg daily.  Has no complaints or medical concerns today. Lab Results  Component Value Date   HGBA1C 10.6 (A) 09/29/2019    BP Readings from Last 3 Encounters:  12/29/19 126/84  09/29/19 114/77  09/01/19 108/75   Lab Results  Component Value Date   CHOL 112 09/29/2019   HDL 31 (L) 09/29/2019   LDLCALC 69 09/29/2019   TRIG 53 09/29/2019   CHOLHDL 3.6 09/29/2019     HPI   Prior to Admission medications   Medication Sig Start Date End Date Taking? Authorizing Provider  metFORMIN (GLUCOPHAGE) 1000 MG tablet Take 1 tablet (1,000 mg total) by mouth 2 (two) times daily with a meal. Patient taking differently: Take 1,000 mg by mouth 2 (two) times daily with a meal. Cutting pill in half 09/01/19  Yes Ilhan Debenedetto, [name], MD  rosuvastatin (CRESTOR) 10 MG tablet Take 1 tablet (10 mg total) by mouth daily. 09/01/19  Yes Vestal Crandall, [name], MD  glipiZIDE (GLUCOTROL) 5 MG tablet Take 1 tablet (5 mg total) by mouth 2 (two) times daily with a meal. 09/01/19 11/30/19  [name], MD    Allergies  Allergen Reactions   Iohexol Hives    Patient Active Problem List   Diagnosis Date Noted   Type 2 diabetes mellitus without complication, without long-term current use of insulin ([place]) 07/01/2018    Past Medical History:  Diagnosis Date   Diabetes mellitus without complication ([place])    Hyperlipidemia    Mandible open fracture (HCC)     Past Surgical History:  Procedure Laterality Date   FACIAL FRACTURE SURGERY     KNEE ARTHROSCOPY Left     Social History   Socioeconomic History   Marital status: Married    Spouse name: Not on file     Number of children: Not on file   Years of education: Not on file   Highest education level: Not on file  Occupational History   Not on file  Tobacco Use   Smoking status: Never Smoker   Smokeless tobacco: Never Used  Substance and Sexual Activity   Alcohol use: No   Drug use: No   Sexual activity: Yes    Birth control/protection: None  Other Topics Concern   Not on file  Social History Narrative   Not on file   Social Determinants of Health   Financial Resource Strain:    Difficulty of Paying Living Expenses:   Food Insecurity:    Worried About Charity fundraiser in the Last Year:    Arboriculturist in the Last Year:   Transportation Needs:    Film/video editor (Medical):    Lack of Transportation (Non-Medical):   Physical Activity:    Days of Exercise per Week:    Minutes of Exercise per Session:   Stress:    Feeling of Stress :   Social Connections:    Frequency of Communication with Friends and Family:    Frequency of Social Gatherings with Friends and Family:    Attends Religious Services:    Active Member of Clubs or Organizations:  Attends Music therapist:    Marital Status:   Intimate Partner Violence:    Fear of Current or Ex-Partner:    Emotionally Abused:    Physically Abused:    Sexually Abused:     Family History  Problem Relation Age of Onset   Diabetes Father    Hypertension Father    Diabetes Paternal Grandmother    Cancer Paternal Grandmother    Diabetes Brother    Hypertension Brother    Colon cancer Neg Hx    Colon polyps Neg Hx    Esophageal cancer Neg Hx    Rectal cancer Neg Hx    Stomach cancer Neg Hx      Review of Systems  Constitutional: Negative.  Negative for chills and fever.  HENT: Negative.  Negative for congestion and sore throat.   Respiratory: Negative.  Negative for cough and shortness of breath.   Cardiovascular: Negative.  Negative for chest pain and  palpitations.  Gastrointestinal: Negative.  Negative for abdominal pain, diarrhea, nausea and vomiting.  Genitourinary: Negative.   Musculoskeletal: Negative.  Negative for back pain, myalgias and neck pain.  Skin: Negative.  Negative for rash.  Neurological: Negative.  Negative for dizziness and headaches.  All other systems reviewed and are negative.    Today's Vitals   12/29/19 1101  BP: 126/84  Pulse: 78  Temp: 97.6 F (36.4 C)  SpO2: 98%  Weight: 210 lb (95.3 kg)  Height: 5' 9.5" (1.765 m)   Body mass index is 30.57 kg/m.   Physical Exam Vitals reviewed.  Constitutional:      Appearance: Normal appearance.  HENT:     Head: Normocephalic.  Eyes:     Extraocular Movements: Extraocular movements intact.     Conjunctiva/sclera: Conjunctivae normal.     Pupils: Pupils are equal, round, and reactive to light.  Cardiovascular:     Rate and Rhythm: Normal rate and regular rhythm.     Pulses: Normal pulses.     Heart sounds: Normal heart sounds.  Pulmonary:     Effort: Pulmonary effort is normal.     Breath sounds: Normal breath sounds.  Musculoskeletal:        General: Normal range of motion.     Cervical back: Normal range of motion and neck supple.  Skin:    General: Skin is warm and dry.     Capillary Refill: Capillary refill takes less than 2 seconds.  Neurological:     General: No focal deficit present.     Mental Status: He is alert and oriented to person, place, and time.  Psychiatric:        Mood and Affect: Mood normal.        Behavior: Behavior normal.      Results for orders placed or performed in visit on 12/29/19 (from the past 24 hour(s))  POCT glycosylated hemoglobin (Hb A1C)     Status: Abnormal   Collection Time: 12/29/19 12:10 PM  Result Value Ref Range   Hemoglobin A1C 6.3 (A) 4.0 - 5.6 %   HbA1c POC (<> result, manual entry)     HbA1c, POC (prediabetic range)     HbA1c, POC (controlled diabetic range)     A total of 30 minutes was spent  with the patient, greater than 50% of which was in counseling/coordination of care regarding diabetes and management including medication and nutrition, cardiovascular risks associated with diabetes, review of all medications, review of most recent blood work results including today's  hemoglobin A1c, review of most recent office visit notes, prognosis and need for follow-up in 6 months.   ASSESSMENT & PLAN: Type 2 diabetes mellitus with hyperglycemia, without long-term current use of insulin ([place]) Much improved diabetes with hemoglobin A1c of 6.3.  Continue glipizide and Metformin.  No changes.  Diet and nutrition discussed. Follow-up in 6 months.   Billy Rios was seen today for diabetes.  Diagnoses and all orders for this visit:  Type 2 diabetes mellitus with hyperglycemia, without long-term current use of insulin (HCC) -     CMP14+EGFR -     Lipid panel -     POCT glycosylated hemoglobin (Hb A1C)    Patient Instructions       If you have lab work done today you will be contacted with your lab results within the next 2 weeks.  If you have not heard from [place] then please contact us. The fastest way to get your results is to register for My Chart.   IF you received an x-ray today, you will receive an invoice from [facility]. Please contact [facility] at [phone] with questions or concerns regarding your invoice.   IF you received labwork today, you will receive an invoice from [place]. Please contact [facility] at [phone] with questions or concerns regarding your invoice.   Our billing staff will not be able to assist you with questions regarding bills from these companies.  You will be contacted with the lab results as soon as they are available. The fastest way to get your results is to activate your My Chart account. Instructions are located on the last page of this paperwork. If you have not heard from [place] regarding the results in 2 weeks, please contact  this office.     Diabetes Mellitus and Nutrition, Adult When you have diabetes (diabetes mellitus), it is very important to have healthy eating habits because your blood sugar (glucose) levels are greatly affected by what you eat and drink. Eating healthy foods in the appropriate amounts, at about the same times every day, can help you:  Control your blood glucose.  Lower your risk of heart disease.  Improve your blood pressure.  Reach or maintain a healthy weight. Every person with diabetes is different, and each person has different needs for a meal plan. Your health care provider may recommend that you work with a diet and nutrition specialist (dietitian) to make a meal plan that is best for you. Your meal plan may vary depending on factors such as:  The calories you need.  The medicines you take.  Your weight.  Your blood glucose, blood pressure, and cholesterol levels.  Your activity level.  Other health conditions you have, such as heart or kidney disease. How do carbohydrates affect me? Carbohydrates, also called carbs, affect your blood glucose level more than any other type of food. Eating carbs naturally raises the amount of glucose in your blood. Carb counting is a method for keeping track of how many carbs you eat. Counting carbs is important to keep your blood glucose at a healthy level, especially if you use insulin or take certain oral diabetes medicines. It is important to know how many carbs you can safely have in each meal. This is different for every person. Your dietitian can help you calculate how many carbs you should have at each meal and for each snack. Foods that contain carbs include:  Bread, cereal, rice, pasta, and crackers.  Potatoes and corn.  Peas, beans, and  lentils.  Milk and yogurt.  Fruit and juice.  Desserts, such as cakes, cookies, ice cream, and candy. How does alcohol affect me? Alcohol can cause a sudden decrease in blood glucose  (hypoglycemia), especially if you use insulin or take certain oral diabetes medicines. Hypoglycemia can be a life-threatening condition. Symptoms of hypoglycemia (sleepiness, dizziness, and confusion) are similar to symptoms of having too much alcohol. If your health care provider says that alcohol is safe for you, follow these guidelines:  Limit alcohol intake to no more than 1 drink per day for nonpregnant women and 2 drinks per day for men. One drink equals 12 oz of beer, 5 oz of wine, or 1 oz of hard liquor.  Do not drink on an empty stomach.  Keep yourself hydrated with water, diet soda, or unsweetened iced tea.  Keep in mind that regular soda, juice, and other mixers may contain a lot of sugar and must be counted as carbs. What are tips for following this plan?  Reading food labels  Start by checking the serving size on the "Nutrition Facts" label of packaged foods and drinks. The amount of calories, carbs, fats, and other nutrients listed on the label is based on one serving of the item. Many items contain more than one serving per package.  Check the total grams (g) of carbs in one serving. You can calculate the number of servings of carbs in one serving by dividing the total carbs by 15. For example, if a food has 30 g of total carbs, it would be equal to 2 servings of carbs.  Check the number of grams (g) of saturated and trans fats in one serving. Choose foods that have low or no amount of these fats.  Check the number of milligrams (mg) of salt (sodium) in one serving. Most people should limit total sodium intake to less than 2,300 mg per day.  Always check the nutrition information of foods labeled as "low-fat" or "nonfat". These foods may be higher in added sugar or refined carbs and should be avoided.  Talk to your dietitian to identify your daily goals for nutrients listed on the label. Shopping  Avoid buying canned, premade, or processed foods. These foods tend to be high  in fat, sodium, and added sugar.  Shop around the outside edge of the grocery store. This includes fresh fruits and vegetables, bulk grains, fresh meats, and fresh dairy. Cooking  Use low-heat cooking methods, such as baking, instead of high-heat cooking methods like deep frying.  Cook using healthy oils, such as olive, canola, or sunflower oil.  Avoid cooking with butter, cream, or high-fat meats. Meal planning  Eat meals and snacks regularly, preferably at the same times every day. Avoid going long periods of time without eating.  Eat foods high in fiber, such as fresh fruits, vegetables, beans, and whole grains. Talk to your dietitian about how many servings of carbs you can eat at each meal.  Eat 4-6 ounces (oz) of lean protein each day, such as lean meat, chicken, fish, eggs, or tofu. One oz of lean protein is equal to: ? 1 oz of meat, chicken, or fish. ? 1 egg. ?  cup of tofu.  Eat some foods each day that contain healthy fats, such as avocado, nuts, seeds, and fish. Lifestyle  Check your blood glucose regularly.  Exercise regularly as told by your health care provider. This may include: ? 150 minutes of moderate-intensity or vigorous-intensity exercise each week. This could be  brisk walking, biking, or water aerobics. ? Stretching and doing strength exercises, such as yoga or weightlifting, at least 2 times a week.  Take medicines as told by your health care provider.  Do not use any products that contain nicotine or tobacco, such as cigarettes and e-cigarettes. If you need help quitting, ask your health care provider.  Work with a Social worker or diabetes educator to identify strategies to manage stress and any emotional and social challenges. Questions to ask a health care provider  Do I need to meet with a diabetes educator?  Do I need to meet with a dietitian?  What number can I call if I have questions?  When are the best times to check my blood glucose? Where to  find more information:  American Diabetes Association: diabetes.org  Academy of Nutrition and Dietetics: www.eatright.[employer] of Diabetes and Digestive and Kidney Diseases (NIH): [employer] Summary  A healthy meal plan will help you control your blood glucose and maintain a healthy lifestyle.  Working with a diet and nutrition specialist (dietitian) can help you make a meal plan that is best for you.  Keep in mind that carbohydrates (carbs) and alcohol have immediate effects on your blood glucose levels. It is important to count carbs and to use alcohol carefully. This information is not intended to replace advice given to you by your health care provider. Make sure you discuss any questions you have with your health care provider. Document Revised: 06/28/2017 Document Reviewed: 08/20/2016 Elsevier Patient Education  2020 Elsevier Inc.      Agustina Caroli, MD [facility]

## 2019-12-30 LAB — CMP14+EGFR
ALT: 23 IU/L (ref 0–44)
AST: 27 IU/L (ref 0–40)
Albumin/Globulin Ratio: 1.6 (ref 1.2–2.2)
Albumin: 4.9 g/dL (ref 3.8–4.9)
Alkaline Phosphatase: 95 IU/L (ref 48–121)
BUN/Creatinine Ratio: 16 (ref 9–20)
BUN: 18 mg/dL (ref 6–24)
Bilirubin Total: 0.5 mg/dL (ref 0.0–1.2)
CO2: 21 mmol/L (ref 20–29)
Calcium: 10 mg/dL (ref 8.7–10.2)
Chloride: 104 mmol/L (ref 96–106)
Creatinine, Ser: 1.14 mg/dL (ref 0.76–1.27)
GFR calc Af Amer: 85 mL/min/{1.73_m2} (ref >59)
GFR calc non Af Amer: 74 mL/min/{1.73_m2} (ref >59)
Globulin, Total: 3 g/dL (ref 1.5–4.5)
Glucose: 106 mg/dL — ABNORMAL HIGH (ref 65–99)
Potassium: 4.9 mmol/L (ref 3.5–5.2)
Sodium: 142 mmol/L (ref 134–144)
Total Protein: 7.9 g/dL (ref 6.0–8.5)

## 2019-12-30 LAB — LIPID PANEL
Chol/HDL Ratio: 3.5 ratio (ref 0.0–5.0)
Cholesterol, Total: 119 mg/dL (ref 100–199)
HDL: 34 mg/dL — ABNORMAL LOW (ref >39)
LDL Chol Calc (NIH): 73 mg/dL (ref 0–99)
Triglycerides: 52 mg/dL (ref 0–149)
VLDL Cholesterol Cal: 12 mg/dL (ref 5–40)

## 2020-01-09 ENCOUNTER — Ambulatory Visit: Payer: 59 | Attending: Internal Medicine

## 2020-01-09 DIAGNOSIS — Z23 Encounter for immunization: Secondary | ICD-10-CM

## 2020-01-09 NOTE — Progress Notes (Signed)
   Covid-19 Vaccination Clinic  Name:  Billy Rios    MRN: [id number] DOB: 1966-05-16  01/09/2020  Mr. Guidroz was observed post Covid-19 immunization for 15 minutes without incident. He was provided with Vaccine Information Sheet and instruction to access the V-Safe system.   Mr. Escoe was instructed to call 911 with any severe reactions post vaccine: Marland Kitchen Difficulty breathing  . Swelling of face and throat  . A fast heartbeat  . A bad rash all over body  . Dizziness and weakness   Immunizations Administered    Name Date Dose VIS Date Route   Pfizer COVID-19 Vaccine 01/09/2020 11:15 AM 0.3 mL 09/23/2018 Intramuscular   Manufacturer: [place]   Lot: [id number]   [place]: [id number]

## 2020-02-16 ENCOUNTER — Other Ambulatory Visit: Payer: Self-pay

## 2020-02-16 ENCOUNTER — Encounter: Payer: Self-pay | Admitting: Emergency Medicine

## 2020-02-16 ENCOUNTER — Ambulatory Visit (INDEPENDENT_AMBULATORY_CARE_PROVIDER_SITE_OTHER): Payer: 59 | Admitting: Emergency Medicine

## 2020-02-16 VITALS — BP 105/67 | HR 73 | Temp 97.6°F | Resp 16 | Ht 69.5 in | Wt 213.0 lb

## 2020-02-16 DIAGNOSIS — E1165 Type 2 diabetes mellitus with hyperglycemia: Secondary | ICD-10-CM

## 2020-02-16 DIAGNOSIS — E162 Hypoglycemia, unspecified: Secondary | ICD-10-CM

## 2020-02-16 NOTE — Assessment & Plan Note (Signed)
Episodes of hypoglycemia just before lunchtime. Advised to continue low dose of Metformin, 500 mg twice a day, and decrease dose of glipizide to 5 mg only once a day.  Advised to have snacks between meals and at bedtime.  Advised to stop glipizide altogether if hypoglycemia episodes continue. Follow-up as scheduled.

## 2020-02-16 NOTE — Progress Notes (Signed)
Billy Rios 54 y.o.   Chief Complaint  Patient presents with   Diabetes    per pt low blood glucose readings - 01/25/2020 50, 01/29/2020 65    HISTORY OF PRESENT ILLNESS: This is a 54 y.o. male complaining of hypoglycemic episodes since I saw him on June 2021:  49, 65, 61, 37 all at the same time 11am. History of diabetes on Metformin and glipizide.  Patient was prescribed Metformin 1000 mg twice a day but started taking only 500 mg twice a day due to GI side effects.  Tolerates the lower dose better.  Taking glipizide 5 mg twice a day.  Has breakfast around 8 in the morning and lunch around noon.  Hypoglycemic episodes happen around 11 in the morning.  He does not eat any snacks between breakfast and lunch.  No hypoglycemic attacks in the afternoons.  No other complaints or medical concerns today.  HPI   Prior to Admission medications   Medication Sig Start Date End Date Taking? Authorizing Provider  metFORMIN (GLUCOPHAGE) 1000 MG tablet Take 1 tablet (1,000 mg total) by mouth 2 (two) times daily with a meal. Patient taking differently: Take 1,000 mg by mouth 2 (two) times daily with a meal. Cutting pill in half 09/01/19  Yes Billy Rios  rosuvastatin (CRESTOR) 10 MG tablet Take 1 tablet (10 mg total) by mouth daily. 09/01/19  Yes Billy Rios  glipiZIDE (GLUCOTROL) 5 MG tablet Take 1 tablet (5 mg total) by mouth 2 (two) times daily with a meal. 09/01/19 11/30/19  Billy Rios    Allergies  Allergen Reactions   Iohexol Hives    Patient Active Problem List   Diagnosis Date Noted   Type 2 diabetes mellitus with hyperglycemia, without long-term current use of insulin ([place]) 07/01/2018    Past Medical History:  Diagnosis Date   Diabetes mellitus without complication ([place])    Hyperlipidemia    Mandible open fracture (HCC)     Past Surgical History:  Procedure Laterality Date   FACIAL FRACTURE SURGERY     KNEE ARTHROSCOPY Left      Social History   Socioeconomic History   Marital status: Married    Spouse name: Not on file   Number of children: Not on file   Years of education: Not on file   Highest education level: Not on file  Occupational History   Not on file  Tobacco Use   Smoking status: Never Smoker   Smokeless tobacco: Never Used  Vaping Use   Vaping Use: Never used  Substance and Sexual Activity   Alcohol use: No   Drug use: No   Sexual activity: Yes    Birth control/protection: None  Other Topics Concern   Not on file  Social History Narrative   Not on file   Social Determinants of Health   Financial Resource Strain:    Difficulty of Paying Living Expenses:   Food Insecurity:    Worried About Charity fundraiser in the Last Year:    Arboriculturist in the Last Year:   Transportation Needs:    Film/video editor (Medical):    Lack of Transportation (Non-Medical):   Physical Activity:    Days of Exercise per Week:    Minutes of Exercise per Session:   Stress:    Feeling of Stress :   Social Connections:    Frequency of Communication with Friends and Family:    Frequency of  Social Gatherings with Friends and Family:    Attends Religious Services:    Active Member of Clubs or Organizations:    Attends Music therapist:    Marital Status:   Intimate Partner Violence:    Fear of Current or Ex-Partner:    Emotionally Abused:    Physically Abused:    Sexually Abused:     Family History  Problem Relation Age of Onset   Diabetes Father    Hypertension Father    Diabetes Paternal Grandmother    Cancer Paternal Grandmother    Diabetes Brother    Hypertension Brother    Colon cancer Neg Hx    Colon polyps Neg Hx    Esophageal cancer Neg Hx    Rectal cancer Neg Hx    Stomach cancer Neg Hx      Review of Systems  Constitutional: Negative.  Negative for chills and fever.  HENT: Negative.  Negative for congestion  and sore throat.   Respiratory: Negative.  Negative for cough and shortness of breath.   Cardiovascular: Negative.  Negative for chest pain and palpitations.  Gastrointestinal: Negative.  Negative for abdominal pain, diarrhea, nausea and vomiting.  Genitourinary: Negative.  Negative for dysuria and hematuria.  Skin: Negative.  Negative for rash.  Neurological: Negative for dizziness and headaches.  All other systems reviewed and are negative.  Today's Vitals   02/16/20 1435  BP: 105/67  Pulse: 73  Resp: 16  Temp: 97.6 F (36.4 C)  TempSrc: Temporal  SpO2: 96%  Weight: 213 lb (96.6 kg)  Height: 5' 9.5" (1.765 m)   Body mass index is 31 kg/m.   Physical Exam Vitals reviewed.  Constitutional:      Appearance: Normal appearance.  HENT:     Head: Normocephalic.  Eyes:     Extraocular Movements: Extraocular movements intact.     Pupils: Pupils are equal, round, and reactive to light.  Cardiovascular:     Rate and Rhythm: Normal rate.  Pulmonary:     Effort: Pulmonary effort is normal.  Musculoskeletal:        General: Normal range of motion.     Cervical back: Normal range of motion.  Skin:    General: Skin is warm and dry.     Capillary Refill: Capillary refill takes less than 2 seconds.  Neurological:     General: No focal deficit present.     Mental Status: He is alert and oriented to person, place, and time.  Psychiatric:        Mood and Affect: Mood normal.        Behavior: Behavior normal.      ASSESSMENT & PLAN: Multiple episodes of hypoglycemia Episodes of hypoglycemia just before lunchtime. Advised to continue low dose of Metformin, 500 mg twice a day, and decrease dose of glipizide to 5 mg only once a day.  Advised to have snacks between meals and at bedtime.  Advised to stop glipizide altogether if hypoglycemia episodes continue. Follow-up as scheduled.   Billy Rios was seen today for diabetes.  Diagnoses and all orders for this visit:  Multiple  episodes of hypoglycemia  Type 2 diabetes mellitus with hyperglycemia, without long-term current use of insulin (HCC) -     Cancel: POCT glucose (manual entry) -     Cancel: POCT glycosylated hemoglobin (Hb A1C)    Patient Instructions    Continue low-dose of Metformin at 500 mg twice a day. Decrease glipizide to 5 mg once a day.  Eat low calorie snacks between meals and at bedtime. Follow-up as scheduled.   If you have lab work done today you will be contacted with your lab results within the next 2 weeks.  If you have not heard from [place] then please contact us. The fastest way to get your results is to register for My Chart.   IF you received an x-ray today, you will receive an invoice from [facility]. Please contact [facility] at [phone] with questions or concerns regarding your invoice.   IF you received labwork today, you will receive an invoice from [place]. Please contact [facility] at [phone] with questions or concerns regarding your invoice.   Our billing staff will not be able to assist you with questions regarding bills from these companies.  You will be contacted with the lab results as soon as they are available. The fastest way to get your results is to activate your My Chart account. Instructions are located on the last page of this paperwork. If you have not heard from [place] regarding the results in 2 weeks, please contact this office.     Hypoglycemia Hypoglycemia is when the sugar (glucose) level in your blood is too low. Signs of low blood sugar may include:  Feeling: ? Hungry. ? Worried or nervous (anxious). ? Sweaty and clammy. ? Confused. ? Dizzy. ? Sleepy. ? Sick to your stomach (nauseous).  Having: ? A fast heartbeat. ? A headache. ? A change in your vision. ? Tingling or no feeling (numbness) around your mouth, lips, or tongue. ? Jerky movements that you cannot control (seizure).  Having trouble with: ? Moving  (coordination). ? Sleeping. ? Passing out (fainting). ? Getting upset easily (irritability). Low blood sugar can happen to people who have diabetes and people who do not have diabetes. Low blood sugar can happen quickly, and it can be an emergency. Treating low blood sugar Low blood sugar is often treated by eating or drinking something sugary right away, such as:  Fruit juice, 4-6 oz (120-150 mL).  Regular soda (not diet soda), 4-6 oz (120-150 mL).  Low-fat milk, 4 oz (120 mL).  Several pieces of hard candy.  Sugar or honey, 1 Tbsp (15 mL). Treating low blood sugar if you have diabetes If you can think clearly and swallow safely, follow the 15:15 rule:  Take 15 grams of a fast-acting carb (carbohydrate). Talk with your doctor about how much you should take.  Always keep a source of fast-acting carb with you, such as: ? Sugar tablets (glucose pills). Take 3-4 pills. ? 6-8 pieces of hard candy. ? 4-6 oz (120-150 mL) of fruit juice. ? 4-6 oz (120-150 mL) of regular (not diet) soda. ? 1 Tbsp (15 mL) honey or sugar.  Check your blood sugar 15 minutes after you take the carb.  If your blood sugar is still at or below 70 mg/dL (3.9 mmol/L), take 15 grams of a carb again.  If your blood sugar does not go above 70 mg/dL (3.9 mmol/L) after 3 tries, get help right away.  After your blood sugar goes back to normal, eat a meal or a snack within 1 hour.  Treating very low blood sugar If your blood sugar is at or below 54 mg/dL (3 mmol/L), you have very low blood sugar (severe hypoglycemia). This may also cause:  Passing out.  Jerky movements you cannot control (seizure).  Losing consciousness (coma). This is an emergency. Do not wait to see if the symptoms will go  away. Get medical help right away. Call your local emergency services (911 in the [place] Do not drive yourself to the hospital. If you have very low blood sugar and you cannot eat or drink, you may need a glucagon shot  (injection). A family member or friend should learn how to check your blood sugar and how to give you a glucagon shot. Ask your doctor if you need to have a glucagon shot kit at home. Follow these instructions at home: General instructions  Take over-the-counter and prescription medicines only as told by your doctor.  Stay aware of your blood sugar as told by your doctor.  Limit alcohol intake to no more than 1 drink a day for nonpregnant women and 2 drinks a day for men. One drink equals 12 oz of beer (355 mL), 5 oz of wine (148 mL), or 1 oz of hard liquor (44 mL).  Keep all follow-up visits as told by your doctor. This is important. If you have diabetes:   Follow your diabetes care plan as told by your doctor. Make sure you: ? Know the signs of low blood sugar. ? Take your medicines as told. ? Follow your exercise and meal plan. ? Eat on time. Do not skip meals. ? Check your blood sugar as often as told by your doctor. Always check it before and after exercise. ? Follow your sick day plan when you cannot eat or drink normally. Make this plan ahead of time with your doctor.  Share your diabetes care plan with: ? Your work or school. ? People you live with.  Check your pee (urine) for ketones: ? When you are sick. ? As told by your doctor.  Carry a card or wear jewelry that says you have diabetes. Contact a doctor if:  You have trouble keeping your blood sugar in your target range.  You have low blood sugar often. Get help right away if:  You still have symptoms after you eat or drink something sugary.  Your blood sugar is at or below 54 mg/dL (3 mmol/L).  You have jerky movements that you cannot control.  You pass out. These symptoms may be an emergency. Do not wait to see if the symptoms will go away. Get medical help right away. Call your local emergency services (911 in the [place] Do not drive yourself to the hospital. Summary  Hypoglycemia happens when the level  of sugar (glucose) in your blood is too low.  Low blood sugar can happen to people who have diabetes and people who do not have diabetes. Low blood sugar can happen quickly, and it can be an emergency.  Make sure you know the signs of low blood sugar and know how to treat it.  Always keep a source of sugar (fast-acting carb) with you to treat low blood sugar. This information is not intended to replace advice given to you by your health care provider. Make sure you discuss any questions you have with your health care provider. Document Revised: 11/06/2018 Document Reviewed: 08/19/2015 Elsevier Patient Education  2020 Elsevier Inc.      Agustina Caroli, [facility]

## 2020-02-16 NOTE — Patient Instructions (Addendum)
Continue low-dose of Metformin at 500 mg twice a day. Decrease glipizide to 5 mg once a day.  Eat low calorie snacks between meals and at bedtime. Follow-up as scheduled.   If you have lab work done today you will be contacted with your lab results within the next 2 weeks.  If you have not heard from [place] then please contact us. The fastest way to get your results is to register for My Chart.   IF you received an x-ray today, you will receive an invoice from [facility]. Please contact [facility] at [phone] with questions or concerns regarding your invoice.   IF you received labwork today, you will receive an invoice from [place]. Please contact [facility] at [phone] with questions or concerns regarding your invoice.   Our billing staff will not be able to assist you with questions regarding bills from these companies.  You will be contacted with the lab results as soon as they are available. The fastest way to get your results is to activate your My Chart account. Instructions are located on the last page of this paperwork. If you have not heard from [place] regarding the results in 2 weeks, please contact this office.     Hypoglycemia Hypoglycemia is when the sugar (glucose) level in your blood is too low. Signs of low blood sugar may include:  Feeling: ? Hungry. ? Worried or nervous (anxious). ? Sweaty and clammy. ? Confused. ? Dizzy. ? Sleepy. ? Sick to your stomach (nauseous).  Having: ? A fast heartbeat. ? A headache. ? A change in your vision. ? Tingling or no feeling (numbness) around your mouth, lips, or tongue. ? Jerky movements that you cannot control (seizure).  Having trouble with: ? Moving (coordination). ? Sleeping. ? Passing out (fainting). ? Getting upset easily (irritability). Low blood sugar can happen to people who have diabetes and people who do not have diabetes. Low blood sugar can happen quickly, and it can be an  emergency. Treating low blood sugar Low blood sugar is often treated by eating or drinking something sugary right away, such as:  Fruit juice, 4-6 oz (120-150 mL).  Regular soda (not diet soda), 4-6 oz (120-150 mL).  Low-fat milk, 4 oz (120 mL).  Several pieces of hard candy.  Sugar or honey, 1 Tbsp (15 mL). Treating low blood sugar if you have diabetes If you can think clearly and swallow safely, follow the 15:15 rule:  Take 15 grams of a fast-acting carb (carbohydrate). Talk with your doctor about how much you should take.  Always keep a source of fast-acting carb with you, such as: ? Sugar tablets (glucose pills). Take 3-4 pills. ? 6-8 pieces of hard candy. ? 4-6 oz (120-150 mL) of fruit juice. ? 4-6 oz (120-150 mL) of regular (not diet) soda. ? 1 Tbsp (15 mL) honey or sugar.  Check your blood sugar 15 minutes after you take the carb.  If your blood sugar is still at or below 70 mg/dL (3.9 mmol/L), take 15 grams of a carb again.  If your blood sugar does not go above 70 mg/dL (3.9 mmol/L) after 3 tries, get help right away.  After your blood sugar goes back to normal, eat a meal or a snack within 1 hour.  Treating very low blood sugar If your blood sugar is at or below 54 mg/dL (3 mmol/L), you have very low blood sugar (severe hypoglycemia). This may also cause:  Passing out.  Jerky movements you cannot control (  seizure).  Losing consciousness (coma). This is an emergency. Do not wait to see if the symptoms will go away. Get medical help right away. Call your local emergency services (911 in the [place] Do not drive yourself to the hospital. If you have very low blood sugar and you cannot eat or drink, you may need a glucagon shot (injection). A family member or friend should learn how to check your blood sugar and how to give you a glucagon shot. Ask your doctor if you need to have a glucagon shot kit at home. Follow these instructions at home: General  instructions  Take over-the-counter and prescription medicines only as told by your doctor.  Stay aware of your blood sugar as told by your doctor.  Limit alcohol intake to no more than 1 drink a day for nonpregnant women and 2 drinks a day for men. One drink equals 12 oz of beer (355 mL), 5 oz of wine (148 mL), or 1 oz of hard liquor (44 mL).  Keep all follow-up visits as told by your doctor. This is important. If you have diabetes:   Follow your diabetes care plan as told by your doctor. Make sure you: ? Know the signs of low blood sugar. ? Take your medicines as told. ? Follow your exercise and meal plan. ? Eat on time. Do not skip meals. ? Check your blood sugar as often as told by your doctor. Always check it before and after exercise. ? Follow your sick day plan when you cannot eat or drink normally. Make this plan ahead of time with your doctor.  Share your diabetes care plan with: ? Your work or school. ? People you live with.  Check your pee (urine) for ketones: ? When you are sick. ? As told by your doctor.  Carry a card or wear jewelry that says you have diabetes. Contact a doctor if:  You have trouble keeping your blood sugar in your target range.  You have low blood sugar often. Get help right away if:  You still have symptoms after you eat or drink something sugary.  Your blood sugar is at or below 54 mg/dL (3 mmol/L).  You have jerky movements that you cannot control.  You pass out. These symptoms may be an emergency. Do not wait to see if the symptoms will go away. Get medical help right away. Call your local emergency services (911 in the [place] Do not drive yourself to the hospital. Summary  Hypoglycemia happens when the level of sugar (glucose) in your blood is too low.  Low blood sugar can happen to people who have diabetes and people who do not have diabetes. Low blood sugar can happen quickly, and it can be an emergency.  Make sure you know the  signs of low blood sugar and know how to treat it.  Always keep a source of sugar (fast-acting carb) with you to treat low blood sugar. This information is not intended to replace advice given to you by your health care provider. Make sure you discuss any questions you have with your health care provider. Document Revised: 11/06/2018 Document Reviewed: 08/19/2015 Elsevier Patient Education  2020 [employer].

## 2020-02-28 ENCOUNTER — Other Ambulatory Visit: Payer: Self-pay | Admitting: Emergency Medicine

## 2020-02-28 DIAGNOSIS — E1165 Type 2 diabetes mellitus with hyperglycemia: Secondary | ICD-10-CM

## 2020-06-29 ENCOUNTER — Encounter: Payer: Self-pay | Admitting: Emergency Medicine

## 2020-06-29 ENCOUNTER — Ambulatory Visit: Payer: 59 | Admitting: Emergency Medicine

## 2020-06-29 ENCOUNTER — Other Ambulatory Visit: Payer: Self-pay

## 2020-06-29 VITALS — BP 127/87 | HR 72 | Temp 97.5°F | Resp 16 | Ht 69.5 in | Wt 219.0 lb

## 2020-06-29 DIAGNOSIS — Z6831 Body mass index (BMI) 31.0-31.9, adult: Secondary | ICD-10-CM | POA: Diagnosis not present

## 2020-06-29 DIAGNOSIS — E1165 Type 2 diabetes mellitus with hyperglycemia: Secondary | ICD-10-CM

## 2020-06-29 LAB — POCT GLYCOSYLATED HEMOGLOBIN (HGB A1C): Hemoglobin A1C: 7.3 % — AB (ref 4.0–5.6)

## 2020-06-29 LAB — GLUCOSE, POCT (MANUAL RESULT ENTRY): POC Glucose: 131 mg/dl — AB (ref 70–99)

## 2020-06-29 MED ORDER — SITAGLIPTIN PHOSPHATE 50 MG PO TABS: 50.0000 mg | ORAL_TABLET | Freq: Every day | ORAL | 3 refills | Status: DC

## 2020-06-29 MED ORDER — METFORMIN HCL 500 MG PO TABS: 500.0000 mg | ORAL_TABLET | Freq: Two times a day (BID) | ORAL | 3 refills | Status: DC

## 2020-06-29 MED ORDER — ROSUVASTATIN CALCIUM 10 MG PO TABS: 10.0000 mg | ORAL_TABLET | Freq: Every day | ORAL | 3 refills | Status: DC

## 2020-06-29 NOTE — Progress Notes (Signed)
Billy Rios 54 y.o.   Chief Complaint  Patient presents with  . Diabetes    follow up 6 months - per patient Glipizide makes his glucose crash, lowest reading was 37 in October 2021  . Medication Refill    Rosuvastatin    HISTORY OF PRESENT ILLNESS: This is a 54 y.o. male with history of diabetes here for follow-up and medication refill. Presently taking Metformin 500 mg twice a day.  Stopped glipizide altogether due to hypoglycemia side effects. Has no complaints or medical concerns today. Lab Results  Component Value Date   HGBA1C 6.3 (A) 12/29/2019    HPI   Prior to Admission medications   Medication Sig Start Date End Date Taking? Authorizing Provider  metFORMIN (GLUCOPHAGE) 1000 MG tablet Take 1 tablet (1,000 mg total) by mouth 2 (two) times daily with a meal. Patient taking differently: Take 1,000 mg by mouth 2 (two) times daily with a meal. Cutting pill in half 09/01/19  Yes Owynn Mosqueda, [name], MD  rosuvastatin (CRESTOR) 10 MG tablet Take 1 tablet (10 mg total) by mouth daily. 09/01/19  Yes Willim Turnage, [name], MD  glipiZIDE (GLUCOTROL) 5 MG tablet TAKE 1 TABLET (5 MG TOTAL) BY MOUTH 2 (TWO) TIMES DAILY WITH A MEAL. 02/28/20 05/28/20  [name], MD    Allergies  Allergen Reactions  . Iohexol Hives    Patient Active Problem List   Diagnosis Date Noted  . Multiple episodes of hypoglycemia 02/16/2020  . Type 2 diabetes mellitus with hyperglycemia, without long-term current use of insulin ([place]) 07/01/2018    Past Medical History:  Diagnosis Date  . Diabetes mellitus without complication ([place])   . Hyperlipidemia   . Mandible open fracture [facility])     Past Surgical History:  Procedure Laterality Date  . FACIAL FRACTURE SURGERY    . KNEE ARTHROSCOPY Left     Social History   Socioeconomic History  . Marital status: Married    Spouse name: Not on file  . Number of children: Not on file  . Years of education: Not on file  . Highest  education level: Not on file  Occupational History  . Not on file  Tobacco Use  . Smoking status: Never Smoker  . Smokeless tobacco: Never Used  Vaping Use  . Vaping Use: Never used  Substance and Sexual Activity  . Alcohol use: No  . Drug use: No  . Sexual activity: Yes    Birth control/protection: None  Other Topics Concern  . Not on file  Social History Narrative  . Not on file   Social Determinants of Health   Financial Resource Strain:   . Difficulty of Paying Living Expenses: Not on file  Food Insecurity:   . Worried About Charity fundraiser in the Last Year: Not on file  . Ran Out of Food in the Last Year: Not on file  Transportation Needs:   . Lack of Transportation (Medical): Not on file  . Lack of Transportation (Non-Medical): Not on file  Physical Activity:   . Days of Exercise per Week: Not on file  . Minutes of Exercise per Session: Not on file  Stress:   . Feeling of Stress : Not on file  Social Connections:   . Frequency of Communication with Friends and Family: Not on file  . Frequency of Social Gatherings with Friends and Family: Not on file  . Attends Religious Services: Not on file  . Active Member of Clubs or Organizations:  Not on file  . Attends Archivist Meetings: Not on file  . Marital Status: Not on file  Intimate Partner Violence:   . Fear of Current or Ex-Partner: Not on file  . Emotionally Abused: Not on file  . Physically Abused: Not on file  . Sexually Abused: Not on file    Family History  Problem Relation Age of Onset  . Diabetes Father   . Hypertension Father   . Diabetes Paternal Grandmother   . Cancer Paternal Grandmother   . Diabetes Brother   . Hypertension Brother   . Colon cancer Neg Hx   . Colon polyps Neg Hx   . Esophageal cancer Neg Hx   . Rectal cancer Neg Hx   . Stomach cancer Neg Hx      Review of Systems  Constitutional: Negative.  Negative for chills and fever.  HENT: Negative.  Negative for  congestion and sore throat.   Respiratory: Negative.  Negative for cough and shortness of breath.   Cardiovascular: Negative.  Negative for chest pain and palpitations.  Gastrointestinal: Negative.  Negative for abdominal pain, blood in stool, diarrhea, melena, nausea and vomiting.  Genitourinary: Negative.  Negative for dysuria and hematuria.  Musculoskeletal: Negative.   Skin: Negative.  Negative for rash.  Neurological: Negative.  Negative for dizziness and headaches.  All other systems reviewed and are negative.   Today's Vitals   06/29/20 0957  BP: 127/87  Pulse: 72  Resp: 16  Temp: (!) 97.5 F (36.4 C)  TempSrc: Temporal  SpO2: 97%  Weight: 219 lb (99.3 kg)  Height: 5' 9.5" (1.765 m)   Body mass index is 31.88 kg/m. Wt Readings from Last 3 Encounters:  06/29/20 219 lb (99.3 kg)  02/16/20 213 lb (96.6 kg)  12/29/19 210 lb (95.3 kg)    Physical Exam Vitals reviewed.  Constitutional:      Appearance: Normal appearance.  HENT:     Head: Normocephalic.  Eyes:     Extraocular Movements: Extraocular movements intact.     Conjunctiva/sclera: Conjunctivae normal.     Pupils: Pupils are equal, round, and reactive to light.  Cardiovascular:     Rate and Rhythm: Normal rate and regular rhythm.     Pulses: Normal pulses.     Heart sounds: Normal heart sounds.  Pulmonary:     Effort: Pulmonary effort is normal.     Breath sounds: Normal breath sounds.  Musculoskeletal:        General: Normal range of motion.     Cervical back: Normal range of motion and neck supple.  Skin:    General: Skin is warm and dry.     Capillary Refill: Capillary refill takes less than 2 seconds.  Neurological:     General: No focal deficit present.     Mental Status: He is alert and oriented to person, place, and time.  Psychiatric:        Mood and Affect: Mood normal.        Behavior: Behavior normal.    Results for orders placed or performed in visit on 06/29/20 (from the past 24  hour(s))  POCT glucose (manual entry)     Status: Abnormal   Collection Time: 06/29/20 10:22 AM  Result Value Ref Range   POC Glucose 131 (A) 70 - 99 mg/dl  POCT glycosylated hemoglobin (Hb A1C)     Status: Abnormal   Collection Time: 06/29/20 10:31 AM  Result Value Ref Range   Hemoglobin A1C 7.3 (  A) 4.0 - 5.6 %   HbA1c POC (<> result, manual entry)     HbA1c, POC (prediabetic range)     HbA1c, POC (controlled diabetic range)       ASSESSMENT & PLAN: Type 2 diabetes mellitus with hyperglycemia, without long-term current use of insulin (HCC) Uncontrolled diabetes with hemoglobin A1c higher than before at 7.3. Continue Metformin 500 mg twice a day. We will start Januvia 50 mg daily. Stop glipizide. Diet and nutrition discussed. Follow-up in 3 months.  Jeremiyah was seen today for diabetes and medication refill.  Diagnoses and all orders for this visit:  Type 2 diabetes mellitus with hyperglycemia, without long-term current use of insulin (HCC) -     rosuvastatin (CRESTOR) 10 MG tablet; Take 1 tablet (10 mg total) by mouth daily. -     POCT glucose (manual entry) -     POCT glycosylated hemoglobin (Hb A1C) -     Microalbumin, urine -     sitaGLIPtin (JANUVIA) 50 MG tablet; Take 1 tablet (50 mg total) by mouth daily. -     metFORMIN (GLUCOPHAGE) 500 MG tablet; Take 1 tablet (500 mg total) by mouth 2 (two) times daily with a meal.  Body mass index (BMI) of 31.0-31.9 in adult    Patient Instructions       If you have lab work done today you will be contacted with your lab results within the next 2 weeks.  If you have not heard from [place] then please contact us. The fastest way to get your results is to register for My Chart.   IF you received an x-ray today, you will receive an invoice from [facility]. Please contact [facility] at [phone] with questions or concerns regarding your invoice.   IF you received labwork today, you will receive an invoice from  [place]. Please contact [facility] at [phone] with questions or concerns regarding your invoice.   Our billing staff will not be able to assist you with questions regarding bills from these companies.  You will be contacted with the lab results as soon as they are available. The fastest way to get your results is to activate your My Chart account. Instructions are located on the last page of this paperwork. If you have not heard from [place] regarding the results in 2 weeks, please contact this office.     Diabetes Mellitus and Nutrition, Adult When you have diabetes (diabetes mellitus), it is very important to have healthy eating habits because your blood sugar (glucose) levels are greatly affected by what you eat and drink. Eating healthy foods in the appropriate amounts, at about the same times every day, can help you:  Control your blood glucose.  Lower your risk of heart disease.  Improve your blood pressure.  Reach or maintain a healthy weight. Every person with diabetes is different, and each person has different needs for a meal plan. Your health care provider may recommend that you work with a diet and nutrition specialist (dietitian) to make a meal plan that is best for you. Your meal plan may vary depending on factors such as:  The calories you need.  The medicines you take.  Your weight.  Your blood glucose, blood pressure, and cholesterol levels.  Your activity level.  Other health conditions you have, such as heart or kidney disease. How do carbohydrates affect me? Carbohydrates, also called carbs, affect your blood glucose level more than any other type of food. Eating carbs naturally raises the amount  of glucose in your blood. Carb counting is a method for keeping track of how many carbs you eat. Counting carbs is important to keep your blood glucose at a healthy level, especially if you use insulin or take certain oral diabetes medicines. It is important to know how  many carbs you can safely have in each meal. This is different for every person. Your dietitian can help you calculate how many carbs you should have at each meal and for each snack. Foods that contain carbs include:  Bread, cereal, rice, pasta, and crackers.  Potatoes and corn.  Peas, beans, and lentils.  Milk and yogurt.  Fruit and juice.  Desserts, such as cakes, cookies, ice cream, and candy. How does alcohol affect me? Alcohol can cause a sudden decrease in blood glucose (hypoglycemia), especially if you use insulin or take certain oral diabetes medicines. Hypoglycemia can be a life-threatening condition. Symptoms of hypoglycemia (sleepiness, dizziness, and confusion) are similar to symptoms of having too much alcohol. If your health care provider says that alcohol is safe for you, follow these guidelines:  Limit alcohol intake to no more than 1 drink per day for nonpregnant women and 2 drinks per day for men. One drink equals 12 oz of beer, 5 oz of wine, or 1 oz of hard liquor.  Do not drink on an empty stomach.  Keep yourself hydrated with water, diet soda, or unsweetened iced tea.  Keep in mind that regular soda, juice, and other mixers may contain a lot of sugar and must be counted as carbs. What are tips for following this plan?  Reading food labels  Start by checking the serving size on the "Nutrition Facts" label of packaged foods and drinks. The amount of calories, carbs, fats, and other nutrients listed on the label is based on one serving of the item. Many items contain more than one serving per package.  Check the total grams (g) of carbs in one serving. You can calculate the number of servings of carbs in one serving by dividing the total carbs by 15. For example, if a food has 30 g of total carbs, it would be equal to 2 servings of carbs.  Check the number of grams (g) of saturated and trans fats in one serving. Choose foods that have low or no amount of these  fats.  Check the number of milligrams (mg) of salt (sodium) in one serving. Most people should limit total sodium intake to less than 2,300 mg per day.  Always check the nutrition information of foods labeled as "low-fat" or "nonfat". These foods may be higher in added sugar or refined carbs and should be avoided.  Talk to your dietitian to identify your daily goals for nutrients listed on the label. Shopping  Avoid buying canned, premade, or processed foods. These foods tend to be high in fat, sodium, and added sugar.  Shop around the outside edge of the grocery store. This includes fresh fruits and vegetables, bulk grains, fresh meats, and fresh dairy. Cooking  Use low-heat cooking methods, such as baking, instead of high-heat cooking methods like deep frying.  Cook using healthy oils, such as olive, canola, or sunflower oil.  Avoid cooking with butter, cream, or high-fat meats. Meal planning  Eat meals and snacks regularly, preferably at the same times every day. Avoid going long periods of time without eating.  Eat foods high in fiber, such as fresh fruits, vegetables, beans, and whole grains. Talk to your dietitian about how  many servings of carbs you can eat at each meal.  Eat 4-6 ounces (oz) of lean protein each day, such as lean meat, chicken, fish, eggs, or tofu. One oz of lean protein is equal to: ? 1 oz of meat, chicken, or fish. ? 1 egg. ?  cup of tofu.  Eat some foods each day that contain healthy fats, such as avocado, nuts, seeds, and fish. Lifestyle  Check your blood glucose regularly.  Exercise regularly as told by your health care provider. This may include: ? 150 minutes of moderate-intensity or vigorous-intensity exercise each week. This could be brisk walking, biking, or water aerobics. ? Stretching and doing strength exercises, such as yoga or weightlifting, at least 2 times a week.  Take medicines as told by your health care provider.  Do not use any  products that contain nicotine or tobacco, such as cigarettes and e-cigarettes. If you need help quitting, ask your health care provider.  Work with a Social worker or diabetes educator to identify strategies to manage stress and any emotional and social challenges. Questions to ask a health care provider  Do I need to meet with a diabetes educator?  Do I need to meet with a dietitian?  What number can I call if I have questions?  When are the best times to check my blood glucose? Where to find more information:  American Diabetes Association: diabetes.org  Academy of Nutrition and Dietetics: www.eatright.[employer] of Diabetes and Digestive and Kidney Diseases (NIH): [employer] Summary  A healthy meal plan will help you control your blood glucose and maintain a healthy lifestyle.  Working with a diet and nutrition specialist (dietitian) can help you make a meal plan that is best for you.  Keep in mind that carbohydrates (carbs) and alcohol have immediate effects on your blood glucose levels. It is important to count carbs and to use alcohol carefully. This information is not intended to replace advice given to you by your health care provider. Make sure you discuss any questions you have with your health care provider. Document Revised: 06/28/2017 Document Reviewed: 08/20/2016 Elsevier Patient Education  2020 Elsevier Inc.      Agustina Caroli, MD [facility]

## 2020-06-29 NOTE — Patient Instructions (Addendum)
   If you have lab work done today you will be contacted with your lab results within the next 2 weeks.  If you have not heard from us then please contact us. The fastest way to get your results is to register for My Chart.   IF you received an x-ray today, you will receive an invoice from [facility]. Please contact  Radiology at [phone] with questions or concerns regarding your invoice.   IF you received labwork today, you will receive an invoice from [facility]. Please contact [facility] at [phone] with questions or concerns regarding your invoice.   Our billing staff will not be able to assist you with questions regarding bills from these companies.  You will be contacted with the lab results as soon as they are available. The fastest way to get your results is to activate your My Chart account. Instructions are located on the last page of this paperwork. If you have not heard from us regarding the results in 2 weeks, please contact this office.     Diabetes Mellitus and Nutrition, Adult When you have diabetes (diabetes mellitus), it is very important to have healthy eating habits because your blood sugar (glucose) levels are greatly affected by what you eat and drink. Eating healthy foods in the appropriate amounts, at about the same times every day, can help you:  Control your blood glucose.  Lower your risk of heart disease.  Improve your blood pressure.  Reach or maintain a healthy weight. Every person with diabetes is different, and each person has different needs for a meal plan. Your health care provider may recommend that you work with a diet and nutrition specialist (dietitian) to make a meal plan that is best for you. Your meal plan may vary depending on factors such as:  The calories you need.  The medicines you take.  Your weight.  Your blood glucose, blood pressure, and cholesterol levels.  Your activity level.  Other health conditions  you have, such as heart or kidney disease. How do carbohydrates affect me? Carbohydrates, also called carbs, affect your blood glucose level more than any other type of food. Eating carbs naturally raises the amount of glucose in your blood. Carb counting is a method for keeping track of how many carbs you eat. Counting carbs is important to keep your blood glucose at a healthy level, especially if you use insulin or take certain oral diabetes medicines. It is important to know how many carbs you can safely have in each meal. This is different for every person. Your dietitian can help you calculate how many carbs you should have at each meal and for each snack. Foods that contain carbs include:  Bread, cereal, rice, pasta, and crackers.  Potatoes and corn.  Peas, beans, and lentils.  Milk and yogurt.  Fruit and juice.  Desserts, such as cakes, cookies, ice cream, and candy. How does alcohol affect me? Alcohol can cause a sudden decrease in blood glucose (hypoglycemia), especially if you use insulin or take certain oral diabetes medicines. Hypoglycemia can be a life-threatening condition. Symptoms of hypoglycemia (sleepiness, dizziness, and confusion) are similar to symptoms of having too much alcohol. If your health care provider says that alcohol is safe for you, follow these guidelines:  Limit alcohol intake to no more than 1 drink per day for nonpregnant women and 2 drinks per day for men. One drink equals 12 oz of beer, 5 oz of wine, or 1 oz of hard liquor.    Do not drink on an empty stomach.  Keep yourself hydrated with water, diet soda, or unsweetened iced tea.  Keep in mind that regular soda, juice, and other mixers may contain a lot of sugar and must be counted as carbs. What are tips for following this plan?  Reading food labels  Start by checking the serving size on the "Nutrition Facts" label of packaged foods and drinks. The amount of calories, carbs, fats, and other  nutrients listed on the label is based on one serving of the item. Many items contain more than one serving per package.  Check the total grams (g) of carbs in one serving. You can calculate the number of servings of carbs in one serving by dividing the total carbs by 15. For example, if a food has 30 g of total carbs, it would be equal to 2 servings of carbs.  Check the number of grams (g) of saturated and trans fats in one serving. Choose foods that have low or no amount of these fats.  Check the number of milligrams (mg) of salt (sodium) in one serving. Most people should limit total sodium intake to less than 2,300 mg per day.  Always check the nutrition information of foods labeled as "low-fat" or "nonfat". These foods may be higher in added sugar or refined carbs and should be avoided.  Talk to your dietitian to identify your daily goals for nutrients listed on the label. Shopping  Avoid buying canned, premade, or processed foods. These foods tend to be high in fat, sodium, and added sugar.  Shop around the outside edge of the grocery store. This includes fresh fruits and vegetables, bulk grains, fresh meats, and fresh dairy. Cooking  Use low-heat cooking methods, such as baking, instead of high-heat cooking methods like deep frying.  Cook using healthy oils, such as olive, canola, or sunflower oil.  Avoid cooking with butter, cream, or high-fat meats. Meal planning  Eat meals and snacks regularly, preferably at the same times every day. Avoid going long periods of time without eating.  Eat foods high in fiber, such as fresh fruits, vegetables, beans, and whole grains. Talk to your dietitian about how many servings of carbs you can eat at each meal.  Eat 4-6 ounces (oz) of lean protein each day, such as lean meat, chicken, fish, eggs, or tofu. One oz of lean protein is equal to: ? 1 oz of meat, chicken, or fish. ? 1 egg. ?  cup of tofu.  Eat some foods each day that contain  healthy fats, such as avocado, nuts, seeds, and fish. Lifestyle  Check your blood glucose regularly.  Exercise regularly as told by your health care provider. This may include: ? 150 minutes of moderate-intensity or vigorous-intensity exercise each week. This could be brisk walking, biking, or water aerobics. ? Stretching and doing strength exercises, such as yoga or weightlifting, at least 2 times a week.  Take medicines as told by your health care provider.  Do not use any products that contain nicotine or tobacco, such as cigarettes and e-cigarettes. If you need help quitting, ask your health care provider.  Work with a counselor or diabetes educator to identify strategies to manage stress and any emotional and social challenges. Questions to ask a health care provider  Do I need to meet with a diabetes educator?  Do I need to meet with a dietitian?  What number can I call if I have questions?  When are the best times to   times to check my blood glucose? Where to find more information:  American Diabetes Association: diabetes.org  Academy of Nutrition and Dietetics: www.eatright.org  National Institute of Diabetes and Digestive and Kidney Diseases (NIH): www.niddk.nih.gov Summary  A healthy meal plan will help you control your blood glucose and maintain a healthy lifestyle.  Working with a diet and nutrition specialist (dietitian) can help you make a meal plan that is best for you.  Keep in mind that carbohydrates (carbs) and alcohol have immediate effects on your blood glucose levels. It is important to count carbs and to use alcohol carefully. This information is not intended to replace advice given to you by your health care provider. Make sure you discuss any questions you have with your health care provider. Document Revised: 06/28/2017 Document Reviewed: 08/20/2016 Elsevier Patient Education  2020 [employer]  

## 2020-06-29 NOTE — Assessment & Plan Note (Signed)
Uncontrolled diabetes with hemoglobin A1c higher than before at 7.3. Continue Metformin 500 mg twice a day. We will start Januvia 50 mg daily. Stop glipizide. Diet and nutrition discussed. Follow-up in 3 months.

## 2020-06-30 LAB — MICROALBUMIN, URINE: Microalbumin, Urine: <3 ug/mL

## 2020-07-12 DIAGNOSIS — R0789 Other chest pain: Secondary | ICD-10-CM | POA: Diagnosis not present

## 2020-07-12 DIAGNOSIS — R079 Chest pain, unspecified: Secondary | ICD-10-CM | POA: Diagnosis not present

## 2020-08-04 ENCOUNTER — Ambulatory Visit: Payer: 59 | Admitting: Emergency Medicine

## 2020-08-04 ENCOUNTER — Other Ambulatory Visit: Payer: Self-pay

## 2020-08-04 ENCOUNTER — Encounter: Payer: Self-pay | Admitting: *Deleted

## 2020-08-04 ENCOUNTER — Encounter: Payer: Self-pay | Admitting: Emergency Medicine

## 2020-08-04 VITALS — BP 120/80 | HR 101 | Temp 97.9°F | Resp 16 | Ht 69.0 in | Wt 213.0 lb

## 2020-08-04 DIAGNOSIS — Z09 Encounter for follow-up examination after completed treatment for conditions other than malignant neoplasm: Secondary | ICD-10-CM | POA: Diagnosis not present

## 2020-08-04 DIAGNOSIS — R0789 Other chest pain: Secondary | ICD-10-CM

## 2020-08-04 DIAGNOSIS — T07XXXA Unspecified multiple injuries, initial encounter: Secondary | ICD-10-CM

## 2020-08-04 DIAGNOSIS — M542 Cervicalgia: Secondary | ICD-10-CM | POA: Diagnosis not present

## 2020-08-04 NOTE — Patient Instructions (Addendum)
   If you have lab work done today you will be contacted with your lab results within the next 2 weeks.  If you have not heard from us then please contact us. The fastest way to get your results is to register for My Chart.   IF you received an x-ray today, you will receive an invoice from [facility]. Please contact [facility] at [phone] with questions or concerns regarding your invoice.   IF you received labwork today, you will receive an invoice from [facility]. Please contact [facility] at [phone] with questions or concerns regarding your invoice.   Our billing staff will not be able to assist you with questions regarding bills from these companies.  You will be contacted with the lab results as soon as they are available. The fastest way to get your results is to activate your My Chart account. Instructions are located on the last page of this paperwork. If you have not heard from us regarding the results in 2 weeks, please contact this office.      Health Maintenance, Male Adopting a healthy lifestyle and getting preventive care are important in promoting health and wellness. Ask your health care provider about:  The right schedule for you to have regular tests and exams.  Things you can do on your own to prevent diseases and keep yourself healthy. What should I know about diet, weight, and exercise? Eat a healthy diet   Eat a diet that includes plenty of vegetables, fruits, low-fat dairy products, and lean protein.  Do not eat a lot of foods that are high in solid fats, added sugars, or sodium. Maintain a healthy weight Body mass index (BMI) is a measurement that can be used to identify possible weight problems. It estimates body fat based on height and weight. Your health care provider can help determine your BMI and help you achieve or maintain a healthy weight. Get regular exercise Get regular exercise. This is one of the most important things you  can do for your health. Most adults should:  Exercise for at least 150 minutes each week. The exercise should increase your heart rate and make you sweat (moderate-intensity exercise).  Do strengthening exercises at least twice a week. This is in addition to the moderate-intensity exercise.  Spend less time sitting. Even light physical activity can be beneficial. Watch cholesterol and blood lipids Have your blood tested for lipids and cholesterol at 54 years of age, then have this test every 5 years. You may need to have your cholesterol levels checked more often if:  Your lipid or cholesterol levels are high.  You are older than 54 years of age.  You are at high risk for heart disease. What should I know about cancer screening? Many types of cancers can be detected early and may often be prevented. Depending on your health history and family history, you may need to have cancer screening at various ages. This may include screening for:  Colorectal cancer.  Prostate cancer.  Skin cancer.  Lung cancer. What should I know about heart disease, diabetes, and high blood pressure? Blood pressure and heart disease  High blood pressure causes heart disease and increases the risk of stroke. This is more likely to develop in people who have high blood pressure readings, are of African descent, or are overweight.  Talk with your health care provider about your target blood pressure readings.  Have your blood pressure checked: ? Every 3-5 years if you are 18-39   years of age. ? Every year if you are 40 years old or older.  If you are between the ages of 65 and 75 and are a current or former smoker, ask your health care provider if you should have a one-time screening for abdominal aortic aneurysm (AAA). Diabetes Have regular diabetes screenings. This checks your fasting blood sugar level. Have the screening done:  Once every three years after age 45 if you are at a normal weight and have  a low risk for diabetes.  More often and at a younger age if you are overweight or have a high risk for diabetes. What should I know about preventing infection? Hepatitis B If you have a higher risk for hepatitis B, you should be screened for this virus. Talk with your health care provider to find out if you are at risk for hepatitis B infection. Hepatitis C Blood testing is recommended for:  Everyone born from 1945 through 1965.  Anyone with known risk factors for hepatitis C. Sexually transmitted infections (STIs)  You should be screened each year for STIs, including gonorrhea and chlamydia, if: ? You are sexually active and are younger than 54 years of age. ? You are older than 54 years of age and your health care provider tells you that you are at risk for this type of infection. ? Your sexual activity has changed since you were last screened, and you are at increased risk for chlamydia or gonorrhea. Ask your health care provider if you are at risk.  Ask your health care provider about whether you are at high risk for HIV. Your health care provider may recommend a prescription medicine to help prevent HIV infection. If you choose to take medicine to prevent HIV, you should first get tested for HIV. You should then be tested every 3 months for as long as you are taking the medicine. Follow these instructions at home: Lifestyle  Do not use any products that contain nicotine or tobacco, such as cigarettes, e-cigarettes, and chewing tobacco. If you need help quitting, ask your health care provider.  Do not use street drugs.  Do not share needles.  Ask your health care provider for help if you need support or information about quitting drugs. Alcohol use  Do not drink alcohol if your health care provider tells you not to drink.  If you drink alcohol: ? Limit how much you have to 0-2 drinks a day. ? Be aware of how much alcohol is in your drink. In the [place] one drink equals one 12  oz bottle of beer (355 mL), one 5 oz glass of wine (148 mL), or one 1 oz glass of hard liquor (44 mL). General instructions  Schedule regular health, dental, and eye exams.  Stay current with your vaccines.  Tell your health care provider if: ? You often feel depressed. ? You have ever been abused or do not feel safe at home. Summary  Adopting a healthy lifestyle and getting preventive care are important in promoting health and wellness.  Follow your health care provider's instructions about healthy diet, exercising, and getting tested or screened for diseases.  Follow your health care provider's instructions on monitoring your cholesterol and blood pressure. This information is not intended to replace advice given to you by your health care provider. Make sure you discuss any questions you have with your health care provider. Document Revised: 07/09/2018 Document Reviewed: 07/09/2018 Elsevier Patient Education  2020 Elsevier Inc.  
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
## 2020-09-28 ENCOUNTER — Encounter: Payer: Self-pay | Admitting: Emergency Medicine

## 2020-09-28 ENCOUNTER — Other Ambulatory Visit: Payer: Self-pay

## 2020-09-28 ENCOUNTER — Ambulatory Visit: Payer: 59 | Admitting: Emergency Medicine

## 2020-09-28 VITALS — BP 116/76 | HR 79 | Temp 98.4°F | Resp 16 | Ht 69.5 in | Wt 217.0 lb

## 2020-09-28 DIAGNOSIS — E785 Hyperlipidemia, unspecified: Secondary | ICD-10-CM

## 2020-09-28 DIAGNOSIS — E1169 Type 2 diabetes mellitus with other specified complication: Secondary | ICD-10-CM

## 2020-09-28 DIAGNOSIS — E1165 Type 2 diabetes mellitus with hyperglycemia: Secondary | ICD-10-CM

## 2020-09-28 DIAGNOSIS — E079 Disorder of thyroid, unspecified: Secondary | ICD-10-CM | POA: Diagnosis not present

## 2020-09-28 LAB — POCT GLYCOSYLATED HEMOGLOBIN (HGB A1C): Hemoglobin A1C: 7 % — AB (ref 4.0–5.6)

## 2020-09-28 LAB — GLUCOSE, POCT (MANUAL RESULT ENTRY): POC Glucose: 170 mg/dl — AB (ref 70–99)
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
## 2020-09-28 NOTE — Assessment & Plan Note (Signed)
Incidental finding on cervical spine CT scan done last December. Needs thyroid ultrasound. Needs endocrinology evaluation. Follow-up after that.

## 2020-09-28 NOTE — Patient Instructions (Addendum)
   If you have lab work done today you will be contacted with your lab results within the next 2 weeks.  If you have not heard from us then please contact us. The fastest way to get your results is to register for My Chart.   IF you received an x-ray today, you will receive an invoice from [facility]. Please contact [facility] at [phone] with questions or concerns regarding your invoice.   IF you received labwork today, you will receive an invoice from [facility]. Please contact [facility] at [phone] with questions or concerns regarding your invoice.   Our billing staff will not be able to assist you with questions regarding bills from these companies.  You will be contacted with the lab results as soon as they are available. The fastest way to get your results is to activate your My Chart account. Instructions are located on the last page of this paperwork. If you have not heard from us regarding the results in 2 weeks, please contact this office.     Diabetes Mellitus and Nutrition, Adult When you have diabetes, or diabetes mellitus, it is very important to have healthy eating habits because your blood sugar (glucose) levels are greatly affected by what you eat and drink. Eating healthy foods in the right amounts, at about the same times every day, can help you:  Control your blood glucose.  Lower your risk of heart disease.  Improve your blood pressure.  Reach or maintain a healthy weight. What can affect my meal plan? Every person with diabetes is different, and each person has different needs for a meal plan. Your health care provider may recommend that you work with a dietitian to make a meal plan that is best for you. Your meal plan may vary depending on factors such as:  The calories you need.  The medicines you take.  Your weight.  Your blood glucose, blood pressure, and cholesterol levels.  Your activity level.  Other health conditions you  have, such as heart or kidney disease. How do carbohydrates affect me? Carbohydrates, also called carbs, affect your blood glucose level more than any other type of food. Eating carbs naturally raises the amount of glucose in your blood. Carb counting is a method for keeping track of how many carbs you eat. Counting carbs is important to keep your blood glucose at a healthy level, especially if you use insulin or take certain oral diabetes medicines. It is important to know how many carbs you can safely have in each meal. This is different for every person. Your dietitian can help you calculate how many carbs you should have at each meal and for each snack. How does alcohol affect me? Alcohol can cause a sudden decrease in blood glucose (hypoglycemia), especially if you use insulin or take certain oral diabetes medicines. Hypoglycemia can be a life-threatening condition. Symptoms of hypoglycemia, such as sleepiness, dizziness, and confusion, are similar to symptoms of having too much alcohol.  Do not drink alcohol if: ? Your health care provider tells you not to drink. ? You are pregnant, may be pregnant, or are planning to become pregnant.  If you drink alcohol: ? Do not drink on an empty stomach. ? Limit how much you use to:  0-1 drink a day for women.  0-2 drinks a day for men. ? Be aware of how much alcohol is in your drink. In the [place] one drink equals one 12 oz bottle of beer (355 mL),   one 5 oz glass of wine (148 mL), or one 1 oz glass of hard liquor (44 mL). ? Keep yourself hydrated with water, diet soda, or unsweetened iced tea.  Keep in mind that regular soda, juice, and other mixers may contain a lot of sugar and must be counted as carbs. What are tips for following this plan? Reading food labels  Start by checking the serving size on the "Nutrition Facts" label of packaged foods and drinks. The amount of calories, carbs, fats, and other nutrients listed on the label is based on  one serving of the item. Many items contain more than one serving per package.  Check the total grams (g) of carbs in one serving. You can calculate the number of servings of carbs in one serving by dividing the total carbs by 15. For example, if a food has 30 g of total carbs per serving, it would be equal to 2 servings of carbs.  Check the number of grams (g) of saturated fats and trans fats in one serving. Choose foods that have a low amount or none of these fats.  Check the number of milligrams (mg) of salt (sodium) in one serving. Most people should limit total sodium intake to less than 2,300 mg per day.  Always check the nutrition information of foods labeled as "low-fat" or "nonfat." These foods may be higher in added sugar or refined carbs and should be avoided.  Talk to your dietitian to identify your daily goals for nutrients listed on the label. Shopping  Avoid buying canned, pre-made, or processed foods. These foods tend to be high in fat, sodium, and added sugar.  Shop around the outside edge of the grocery store. This is where you will most often find fresh fruits and vegetables, bulk grains, fresh meats, and fresh dairy. Cooking  Use low-heat cooking methods, such as baking, instead of high-heat cooking methods like deep frying.  Cook using healthy oils, such as olive, canola, or sunflower oil.  Avoid cooking with butter, cream, or high-fat meats. Meal planning  Eat meals and snacks regularly, preferably at the same times every day. Avoid going long periods of time without eating.  Eat foods that are high in fiber, such as fresh fruits, vegetables, beans, and whole grains. Talk with your dietitian about how many servings of carbs you can eat at each meal.  Eat 4-6 oz (112-168 g) of lean protein each day, such as lean meat, chicken, fish, eggs, or tofu. One ounce (oz) of lean protein is equal to: ? 1 oz (28 g) of meat, chicken, or fish. ? 1 egg. ?  cup (62 g) of  tofu.  Eat some foods each day that contain healthy fats, such as avocado, nuts, seeds, and fish.   What foods should I eat? Fruits Berries. Apples. Oranges. Peaches. Apricots. Plums. Grapes. Mango. Papaya. Pomegranate. Kiwi. Cherries. Vegetables Lettuce. Spinach. Leafy greens, including kale, chard, collard greens, and mustard greens. Beets. Cauliflower. Cabbage. Broccoli. Carrots. Green beans. Tomatoes. Peppers. Onions. Cucumbers. Brussels sprouts. Grains Whole grains, such as whole-wheat or whole-grain bread, crackers, tortillas, cereal, and pasta. Unsweetened oatmeal. Quinoa. Brown or wild rice. Meats and other proteins Seafood. Poultry without skin. Lean cuts of poultry and beef. Tofu. Nuts. Seeds. Dairy Low-fat or fat-free dairy products such as milk, yogurt, and cheese. The items listed above may not be a complete list of foods and beverages you can eat. Contact a dietitian for more information. What foods should I avoid? Fruits Fruits canned   with syrup. Vegetables Canned vegetables. Frozen vegetables with butter or cream sauce. Grains Refined white flour and flour products such as bread, pasta, snack foods, and cereals. Avoid all processed foods. Meats and other proteins Fatty cuts of meat. Poultry with skin. Breaded or fried meats. Processed meat. Avoid saturated fats. Dairy Full-fat yogurt, cheese, or milk. Beverages Sweetened drinks, such as soda or iced tea. The items listed above may not be a complete list of foods and beverages you should avoid. Contact a dietitian for more information. Questions to ask a health care provider  Do I need to meet with a diabetes educator?  Do I need to meet with a dietitian?  What number can I call if I have questions?  When are the best times to check my blood glucose? Where to find more information:  American Diabetes Association: diabetes.org  Academy of Nutrition and Dietetics: www.eatright.org  National Institute of  Diabetes and Digestive and Kidney Diseases: www.niddk.nih.gov  Association of Diabetes Care and Education Specialists: www.diabeteseducator.org Summary  It is important to have healthy eating habits because your blood sugar (glucose) levels are greatly affected by what you eat and drink.  A healthy meal plan will help you control your blood glucose and maintain a healthy lifestyle.  Your health care provider may recommend that you work with a dietitian to make a meal plan that is best for you.  Keep in mind that carbohydrates (carbs) and alcohol have immediate effects on your blood glucose levels. It is important to count carbs and to use alcohol carefully. This information is not intended to replace advice given to you by your health care provider. Make sure you discuss any questions you have with your health care provider. Document Revised: 06/23/2019 Document Reviewed: 06/23/2019 Elsevier Patient Education  2021 Elsevier Inc.  

## 2020-09-28 NOTE — Assessment & Plan Note (Signed)
Improved diabetes with hemoglobin A1c of 7.0.  Continue Metformin and Januvia. Diet and nutrition discussed. Follow-up in 3 months.

## 2020-09-29 LAB — CMP14+EGFR
ALT: 20 IU/L (ref 0–44)
AST: 17 IU/L (ref 0–40)
Albumin/Globulin Ratio: 1.6 (ref 1.2–2.2)
Albumin: 4.2 g/dL (ref 3.8–4.9)
Alkaline Phosphatase: 90 IU/L (ref 44–121)
BUN/Creatinine Ratio: 13 (ref 9–20)
BUN: 15 mg/dL (ref 6–24)
Bilirubin Total: 0.3 mg/dL (ref 0.0–1.2)
CO2: 23 mmol/L (ref 20–29)
Calcium: 9.2 mg/dL (ref 8.7–10.2)
Chloride: 107 mmol/L — ABNORMAL HIGH (ref 96–106)
Creatinine, Ser: 1.14 mg/dL (ref 0.76–1.27)
Globulin, Total: 2.6 g/dL (ref 1.5–4.5)
Glucose: 154 mg/dL — ABNORMAL HIGH (ref 65–99)
Potassium: 4.7 mmol/L (ref 3.5–5.2)
Sodium: 143 mmol/L (ref 134–144)
Total Protein: 6.8 g/dL (ref 6.0–8.5)
eGFR: 77 mL/min/{1.73_m2} (ref >59)

## 2020-09-29 LAB — LIPID PANEL
Chol/HDL Ratio: 3.8 ratio (ref 0.0–5.0)
Cholesterol, Total: 117 mg/dL (ref 100–199)
HDL: 31 mg/dL — ABNORMAL LOW (ref >39)
LDL Chol Calc (NIH): 72 mg/dL (ref 0–99)
Triglycerides: 65 mg/dL (ref 0–149)
VLDL Cholesterol Cal: 14 mg/dL (ref 5–40)

## 2020-11-21 ENCOUNTER — Encounter: Payer: Self-pay | Admitting: Endocrinology

## 2020-11-21 ENCOUNTER — Ambulatory Visit: Payer: 59 | Admitting: Endocrinology

## 2020-11-21 ENCOUNTER — Other Ambulatory Visit: Payer: Self-pay

## 2020-11-21 VITALS — BP 136/84 | HR 107 | Ht 69.5 in | Wt 219.8 lb

## 2020-11-21 DIAGNOSIS — E079 Disorder of thyroid, unspecified: Secondary | ICD-10-CM | POA: Diagnosis not present

## 2020-11-21 DIAGNOSIS — E1165 Type 2 diabetes mellitus with hyperglycemia: Secondary | ICD-10-CM

## 2020-11-21 LAB — TSH: TSH: 1.36 u[IU]/mL (ref 0.35–4.50)

## 2020-11-21 LAB — T4, FREE: Free T4: 0.88 ng/dL (ref 0.60–1.60)

## 2020-11-21 NOTE — Patient Instructions (Addendum)
Let's check the ultrasound.  you will receive a phone call, about a day and time for an appointment.  Blood tests are requested for you today.   We'll let you know about the results of both of these tests.  If:  1: the thyroid is not overactive, and:  2: a biopsy is recommended by the [place] report.    Please come back here to have the biopsy done.
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
## 2020-11-30 ENCOUNTER — Ambulatory Visit
Admission: RE | Admit: 2020-11-30 | Discharge: 2020-11-30 | Disposition: A | Discharge status: Home / Self Care | Payer: 59 | Source: Ambulatory Visit | Attending: Endocrinology | Admitting: Endocrinology

## 2020-11-30 DIAGNOSIS — E079 Disorder of thyroid, unspecified: Secondary | ICD-10-CM

## 2020-12-05 ENCOUNTER — Other Ambulatory Visit: Payer: Self-pay

## 2020-12-05 ENCOUNTER — Other Ambulatory Visit (HOSPITAL_COMMUNITY)
Admission: RE | Admit: 2020-12-05 | Discharge: 2020-12-05 | Disposition: A | Discharge status: Home / Self Care | Payer: 59 | Source: Ambulatory Visit | Attending: Endocrinology | Admitting: Endocrinology

## 2020-12-05 ENCOUNTER — Ambulatory Visit (INDEPENDENT_AMBULATORY_CARE_PROVIDER_SITE_OTHER): Payer: 59 | Admitting: Endocrinology

## 2020-12-05 ENCOUNTER — Encounter: Payer: Self-pay | Admitting: Endocrinology

## 2020-12-05 VITALS — BP 140/80 | HR 90 | Ht 69.5 in | Wt 220.0 lb

## 2020-12-05 DIAGNOSIS — E1165 Type 2 diabetes mellitus with hyperglycemia: Secondary | ICD-10-CM

## 2020-12-05 DIAGNOSIS — E079 Disorder of thyroid, unspecified: Secondary | ICD-10-CM | POA: Diagnosis not present

## 2020-12-05 DIAGNOSIS — D34 Benign neoplasm of thyroid gland: Secondary | ICD-10-CM | POA: Insufficient documentation

## 2020-12-05 DIAGNOSIS — E041 Nontoxic single thyroid nodule: Secondary | ICD-10-CM | POA: Diagnosis present

## 2020-12-05 NOTE — Patient Instructions (Addendum)
We'll let you know about the biopsy results. If no cancer is found, please come back for a follow-up appointment in 6 months.   
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
## 2020-12-07 LAB — CYTOLOGY - NON PAP

## 2020-12-29 ENCOUNTER — Ambulatory Visit: Payer: Self-pay | Admitting: Emergency Medicine

## 2021-01-17 ENCOUNTER — Encounter: Payer: Self-pay | Admitting: Emergency Medicine

## 2021-01-17 ENCOUNTER — Other Ambulatory Visit: Payer: Self-pay

## 2021-01-17 ENCOUNTER — Ambulatory Visit (INDEPENDENT_AMBULATORY_CARE_PROVIDER_SITE_OTHER): Payer: 59 | Admitting: Emergency Medicine

## 2021-01-17 VITALS — BP 122/80 | HR 81 | Temp 98.4°F | Ht 69.5 in | Wt 219.2 lb

## 2021-01-17 DIAGNOSIS — E1165 Type 2 diabetes mellitus with hyperglycemia: Secondary | ICD-10-CM | POA: Diagnosis not present

## 2021-01-17 DIAGNOSIS — E079 Disorder of thyroid, unspecified: Secondary | ICD-10-CM

## 2021-01-17 LAB — POCT GLYCOSYLATED HEMOGLOBIN (HGB A1C): Hemoglobin A1C: 7.6 % — AB (ref 4.0–5.6)

## 2021-01-17 MED ORDER — TRULICITY 0.75 MG/0.5ML ~~LOC~~ SOAJ: 0.7500 mg | SUBCUTANEOUS | 5 refills | Status: DC

## 2021-01-17 NOTE — Assessment & Plan Note (Signed)
Benign thyroid work-up.  Dr. Cordelia Pen office visit notes reviewed. Thyroid ultrasound report reviewed.  Normal TFTs. Surgical pathology report of thyroid biopsy reviewed.
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
## 2021-01-17 NOTE — Patient Instructions (Signed)
Diabetes Mellitus and Nutrition, Adult When you have diabetes, or diabetes mellitus, it is very important to have healthy eating habits because your blood sugar (glucose) levels are greatly affected by what you eat and drink. Eating healthy foods in the right amounts, at about the same times every day, can help you:  Control your blood glucose.  Lower your risk of heart disease.  Improve your blood pressure.  Reach or maintain a healthy weight. What can affect my meal plan? Every person with diabetes is different, and each person has different needs for a meal plan. Your health care provider may recommend that you work with a dietitian to make a meal plan that is best for you. Your meal plan may vary depending on factors such as:  The calories you need.  The medicines you take.  Your weight.  Your blood glucose, blood pressure, and cholesterol levels.  Your activity level.  Other health conditions you have, such as heart or kidney disease. How do carbohydrates affect me? Carbohydrates, also called carbs, affect your blood glucose level more than any other type of food. Eating carbs naturally raises the amount of glucose in your blood. Carb counting is a method for keeping track of how many carbs you eat. Counting carbs is important to keep your blood glucose at a healthy level, especially if you use insulin or take certain oral diabetes medicines. It is important to know how many carbs you can safely have in each meal. This is different for every person. Your dietitian can help you calculate how many carbs you should have at each meal and for each snack. How does alcohol affect me? Alcohol can cause a sudden decrease in blood glucose (hypoglycemia), especially if you use insulin or take certain oral diabetes medicines. Hypoglycemia can be a life-threatening condition. Symptoms of hypoglycemia, such as sleepiness, dizziness, and confusion, are similar to symptoms of having too much  alcohol.  Do not drink alcohol if: ? Your health care provider tells you not to drink. ? You are pregnant, may be pregnant, or are planning to become pregnant.  If you drink alcohol: ? Do not drink on an empty stomach. ? Limit how much you use to:  0-1 drink a day for women.  0-2 drinks a day for men. ? Be aware of how much alcohol is in your drink. In the [place] one drink equals one 12 oz bottle of beer (355 mL), one 5 oz glass of wine (148 mL), or one 1 oz glass of hard liquor (44 mL). ? Keep yourself hydrated with water, diet soda, or unsweetened iced tea.  Keep in mind that regular soda, juice, and other mixers may contain a lot of sugar and must be counted as carbs. What are tips for following this plan? Reading food labels  Start by checking the serving size on the "Nutrition Facts" label of packaged foods and drinks. The amount of calories, carbs, fats, and other nutrients listed on the label is based on one serving of the item. Many items contain more than one serving per package.  Check the total grams (g) of carbs in one serving. You can calculate the number of servings of carbs in one serving by dividing the total carbs by 15. For example, if a food has 30 g of total carbs per serving, it would be equal to 2 servings of carbs.  Check the number of grams (g) of saturated fats and trans fats in one serving. Choose foods that have   a low amount or none of these fats.  Check the number of milligrams (mg) of salt (sodium) in one serving. Most people should limit total sodium intake to less than 2,300 mg per day.  Always check the nutrition information of foods labeled as "low-fat" or "nonfat." These foods may be higher in added sugar or refined carbs and should be avoided.  Talk to your dietitian to identify your daily goals for nutrients listed on the label. Shopping  Avoid buying canned, pre-made, or processed foods. These foods tend to be high in fat, sodium, and added  sugar.  Shop around the outside edge of the grocery store. This is where you will most often find fresh fruits and vegetables, bulk grains, fresh meats, and fresh dairy. Cooking  Use low-heat cooking methods, such as baking, instead of high-heat cooking methods like deep frying.  Cook using healthy oils, such as olive, canola, or sunflower oil.  Avoid cooking with butter, cream, or high-fat meats. Meal planning  Eat meals and snacks regularly, preferably at the same times every day. Avoid going long periods of time without eating.  Eat foods that are high in fiber, such as fresh fruits, vegetables, beans, and whole grains. Talk with your dietitian about how many servings of carbs you can eat at each meal.  Eat 4-6 oz (112-168 g) of lean protein each day, such as lean meat, chicken, fish, eggs, or tofu. One ounce (oz) of lean protein is equal to: ? 1 oz (28 g) of meat, chicken, or fish. ? 1 egg. ?  cup (62 g) of tofu.  Eat some foods each day that contain healthy fats, such as avocado, nuts, seeds, and fish.   What foods should I eat? Fruits Berries. Apples. Oranges. Peaches. Apricots. Plums. Grapes. Mango. Papaya. Pomegranate. Kiwi. Cherries. Vegetables Lettuce. Spinach. Leafy greens, including kale, chard, collard greens, and mustard greens. Beets. Cauliflower. Cabbage. Broccoli. Carrots. Green beans. Tomatoes. Peppers. Onions. Cucumbers. Brussels sprouts. Grains Whole grains, such as whole-wheat or whole-grain bread, crackers, tortillas, cereal, and pasta. Unsweetened oatmeal. Quinoa. Brown or wild rice. Meats and other proteins Seafood. Poultry without skin. Lean cuts of poultry and beef. Tofu. Nuts. Seeds. Dairy Low-fat or fat-free dairy products such as milk, yogurt, and cheese. The items listed above may not be a complete list of foods and beverages you can eat. Contact a dietitian for more information. What foods should I avoid? Fruits Fruits canned with  syrup. Vegetables Canned vegetables. Frozen vegetables with butter or cream sauce. Grains Refined white flour and flour products such as bread, pasta, snack foods, and cereals. Avoid all processed foods. Meats and other proteins Fatty cuts of meat. Poultry with skin. Breaded or fried meats. Processed meat. Avoid saturated fats. Dairy Full-fat yogurt, cheese, or milk. Beverages Sweetened drinks, such as soda or iced tea. The items listed above may not be a complete list of foods and beverages you should avoid. Contact a dietitian for more information. Questions to ask a health care provider  Do I need to meet with a diabetes educator?  Do I need to meet with a dietitian?  What number can I call if I have questions?  When are the best times to check my blood glucose? Where to find more information:  American Diabetes Association: diabetes.org  Academy of Nutrition and Dietetics: www.eatright.org  National Institute of Diabetes and Digestive and Kidney Diseases: www.niddk.nih.gov  Association of Diabetes Care and Education Specialists: www.diabeteseducator.org Summary  It is important to have healthy eating   habits because your blood sugar (glucose) levels are greatly affected by what you eat and drink.  A healthy meal plan will help you control your blood glucose and maintain a healthy lifestyle.  Your health care provider may recommend that you work with a dietitian to make a meal plan that is best for you.  Keep in mind that carbohydrates (carbs) and alcohol have immediate effects on your blood glucose levels. It is important to count carbs and to use alcohol carefully. This information is not intended to replace advice given to you by your health care provider. Make sure you discuss any questions you have with your health care provider. Document Revised: 06/23/2019 Document Reviewed: 06/23/2019 Elsevier Patient Education  2021 Elsevier Inc.  

## 2021-01-17 NOTE — Assessment & Plan Note (Signed)
Hemoglobin A1c higher than before at 7.6.  Presently taking metformin 500 mg twice a day.  Not taking Jardiance due to cost, too expensive. Will start Trulicity 0.38 mg weekly. Diet and nutrition discussed. Follow-up in 3 to 6 months.

## 2021-01-25 ENCOUNTER — Other Ambulatory Visit: Payer: Self-pay | Admitting: Emergency Medicine

## 2021-01-25 DIAGNOSIS — E1165 Type 2 diabetes mellitus with hyperglycemia: Secondary | ICD-10-CM

## 2021-01-27 NOTE — Telephone Encounter (Addendum)
PA approved 02/05/2956 for Trulicity. I will contact patient.

## 2021-02-13 ENCOUNTER — Telehealth: Payer: Self-pay | Admitting: Emergency Medicine

## 2021-02-13 ENCOUNTER — Other Ambulatory Visit: Payer: Self-pay | Admitting: Emergency Medicine

## 2021-02-13 DIAGNOSIS — U071 COVID-19: Secondary | ICD-10-CM

## 2021-02-13 MED ORDER — NIRMATRELVIR/RITONAVIR (PAXLOVID)TABLET: 3.0000 | ORAL_TABLET | Freq: Two times a day (BID) | ORAL | 0 refills | Status: AC

## 2021-02-13 NOTE — Telephone Encounter (Signed)
Called and spoke with pt about providers recommendations,verb understanding.

## 2021-02-13 NOTE — Telephone Encounter (Signed)
May benefit from Paxlovid antiviral medication.  New prescription sent to pharmacy of record.  Thanks.

## 2021-02-13 NOTE — Telephone Encounter (Signed)
Seeking advice  Patient tested covid+ over the weekend  Cough, congestion

## 2021-06-08 ENCOUNTER — Other Ambulatory Visit: Payer: Self-pay

## 2021-06-08 ENCOUNTER — Encounter: Payer: Self-pay | Admitting: Endocrinology

## 2021-06-08 ENCOUNTER — Ambulatory Visit (INDEPENDENT_AMBULATORY_CARE_PROVIDER_SITE_OTHER): Payer: 59 | Admitting: Endocrinology

## 2021-06-08 DIAGNOSIS — E1165 Type 2 diabetes mellitus with hyperglycemia: Secondary | ICD-10-CM | POA: Diagnosis not present

## 2021-06-08 DIAGNOSIS — E079 Disorder of thyroid, unspecified: Secondary | ICD-10-CM | POA: Diagnosis not present

## 2021-06-08 NOTE — Patient Instructions (Addendum)
please redo the ultrasound in May, 2023.  you will receive a phone call, about a day and time for an appointment.   come back for a follow-up appointment in 1 year.

## 2021-06-08 NOTE — Progress Notes (Signed)
   Subjective:    Patient ID: Billy Rios, male    DOB: 1966-04-26, 55 y.o.   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  Pt says no change in the size of the nodule.   Past Medical History:  Diagnosis Date   Diabetes mellitus without complication ([facility])    Hyperlipidemia    Mandible open fracture ([facility])     Past Surgical History:  Procedure Laterality Date   FACIAL FRACTURE SURGERY     KNEE ARTHROSCOPY Left     Social History   Socioeconomic History   Marital status: Married    Spouse name: Not on file   Number of children: Not on file   Years of education: Not on file   Highest education level: Not on file  Occupational History   Not on file  Tobacco Use   Smoking status: Never   Smokeless tobacco: Never  Vaping Use   Vaping Use: Never used  Substance and Sexual Activity   Alcohol use: No   Drug use: No   Sexual activity: Yes    Birth control/protection: None  Other Topics Concern   Not on file  Social History Narrative   Not on file   Social Determinants of Health   Financial Resource Strain: Not on file  Food Insecurity: Not on file  Transportation Needs: Not on file  Physical Activity: Not on file  Stress: Not on file  Social Connections: Not on file  Intimate Partner Violence: Not on file    Current Outpatient Medications on File Prior to Visit  Medication Sig Dispense Refill   Dulaglutide (TRULICITY) 0.62 BJ/6.2GB SOPN Inject 0.75 mg into the skin once a week. 6 mL 5   metFORMIN (GLUCOPHAGE) 500 MG tablet Take 1 tablet (500 mg total) by mouth 2 (two) times daily with a meal. (Patient not taking: Reported on 01/17/2021) 180 tablet 3   rosuvastatin (CRESTOR) 10 MG tablet Take 1 tablet (10 mg total) by mouth daily. (Patient not taking: Reported on 01/17/2021) 90 tablet 3   No current facility-administered medications on file prior to visit.     Allergies  Allergen Reactions   Iohexol Hives    Family History  Problem Relation Age of Onset   Diabetes Father    Hypertension Father    Diabetes Paternal Grandmother    Cancer Paternal Grandmother    Diabetes Brother    Hypertension Brother    Colon cancer Neg Hx    Colon polyps Neg Hx    Esophageal cancer Neg Hx    Rectal cancer Neg Hx    Stomach cancer Neg Hx    Thyroid disease Neg Hx     There were no vitals taken for this visit.   Review of Systems Denies neck pain.      Objective:   Physical Exam NECK: 4 cm left thyroid nodule.         Assessment & Plan:  Thyroid nodule.  We discussed need for f/u.  No medication is needed  Patient Instructions  please redo the ultrasound in May, 2023.  you will receive a phone call, about a day and time for an appointment.   come back for a follow-up appointment in 1 year.

## 2021-07-20 ENCOUNTER — Ambulatory Visit: Payer: 59 | Admitting: Emergency Medicine

## 2021-08-03 ENCOUNTER — Ambulatory Visit: Payer: 59 | Admitting: Emergency Medicine

## 2021-12-28 ENCOUNTER — Encounter (HOSPITAL_COMMUNITY): Payer: Self-pay

## 2021-12-28 ENCOUNTER — Emergency Department (HOSPITAL_COMMUNITY)
Admission: EM | Admit: 2021-12-28 | Discharge: 2021-12-28 | Disposition: A | Discharge status: Home / Self Care | Payer: 59 | Attending: Emergency Medicine | Admitting: Emergency Medicine

## 2021-12-28 ENCOUNTER — Other Ambulatory Visit: Payer: Self-pay

## 2021-12-28 ENCOUNTER — Emergency Department (HOSPITAL_COMMUNITY): Payer: 59

## 2021-12-28 DIAGNOSIS — R739 Hyperglycemia, unspecified: Secondary | ICD-10-CM

## 2021-12-28 DIAGNOSIS — Z7984 Long term (current) use of oral hypoglycemic drugs: Secondary | ICD-10-CM | POA: Insufficient documentation

## 2021-12-28 DIAGNOSIS — R0789 Other chest pain: Secondary | ICD-10-CM | POA: Diagnosis not present

## 2021-12-28 DIAGNOSIS — R079 Chest pain, unspecified: Secondary | ICD-10-CM | POA: Diagnosis present

## 2021-12-28 DIAGNOSIS — E1165 Type 2 diabetes mellitus with hyperglycemia: Secondary | ICD-10-CM | POA: Diagnosis not present

## 2021-12-28 DIAGNOSIS — E860 Dehydration: Secondary | ICD-10-CM | POA: Insufficient documentation

## 2021-12-28 LAB — CBC
HCT: 51.4 % (ref 39.0–52.0)
Hemoglobin: 16.3 g/dL (ref 13.0–17.0)
MCH: 25.9 pg — ABNORMAL LOW (ref 26.0–34.0)
MCHC: 31.7 g/dL (ref 30.0–36.0)
MCV: 81.7 fL (ref 80.0–100.0)
Platelets: 251 10*3/uL (ref 150–400)
RBC: 6.29 MIL/uL — ABNORMAL HIGH (ref 4.22–5.81)
RDW: 13.1 % (ref 11.5–15.5)
WBC: 6.3 10*3/uL (ref 4.0–10.5)
nRBC: 0 % (ref 0.0–0.2)

## 2021-12-28 LAB — BASIC METABOLIC PANEL
Anion gap: 7 (ref 5–15)
BUN: 17 mg/dL (ref 6–20)
CO2: 26 mmol/L (ref 22–32)
Calcium: 9.1 mg/dL (ref 8.9–10.3)
Chloride: 102 mmol/L (ref 98–111)
Creatinine, Ser: 1.16 mg/dL (ref 0.61–1.24)
GFR, Estimated: >60 mL/min (ref >60)
Glucose, Bld: 260 mg/dL — ABNORMAL HIGH (ref 70–99)
Potassium: 4.6 mmol/L (ref 3.5–5.1)
Sodium: 135 mmol/L (ref 135–145)

## 2021-12-28 LAB — CBG MONITORING, ED
Glucose-Capillary: 257 mg/dL — ABNORMAL HIGH (ref 70–99)
Glucose-Capillary: 186 mg/dL — ABNORMAL HIGH (ref 70–99)

## 2021-12-28 LAB — TROPONIN I (HIGH SENSITIVITY)
Troponin I (High Sensitivity): 4 ng/L (ref <18)
Troponin I (High Sensitivity): 3 ng/L (ref <18)

## 2021-12-28 MED ORDER — SODIUM CHLORIDE 0.9 % IV BOLUS
1000.0000 mL | Freq: Once | INTRAVENOUS | Status: AC
  Administered 2021-12-28: 1000 mL via INTRAVENOUS

## 2021-12-28 NOTE — ED Provider Notes (Signed)
[facility] EMERGENCY DEPARTMENT Provider Note   CSN: [id number] Arrival date & time: 12/28/21  1427     History  Chief Complaint  Patient presents with   Hyperglycemia    Billy Rios is a 56 y.o. male history of diabetes here presenting with hyperglycemia and chest pain.  Patient states that he did not refill his Trulicity about a month ago.  He states that he has not been taking it and has noticed some increased thirst over the last week or so.  He states that his blood sugar has been elevated for the last 2 to 3 days.  He states that he has been nauseated and urinating frequently.  He also has some chest pressure today.  He states that he started taking metformin that was prescribed to him before and his blood sugar came down to the 300s today.  The history is provided by the patient.      Home Medications Prior to Admission medications   Medication Sig Start Date End Date Taking? Authorizing Provider  Dulaglutide (TRULICITY) 0.17 CB/4.4HQ SOPN Inject 0.75 mg into the skin once a week. 01/17/21   [name], MD  metFORMIN (GLUCOPHAGE) 500 MG tablet Take 1 tablet (500 mg total) by mouth 2 (two) times daily with a meal. Patient not taking: Reported on 01/17/2021 06/29/20   [name], MD  rosuvastatin (CRESTOR) 10 MG tablet Take 1 tablet (10 mg total) by mouth daily. Patient not taking: Reported on 01/17/2021 06/29/20   [name], MD      Allergies    Iohexol    Review of Systems   Review of Systems  Cardiovascular:  Positive for chest pain.  Gastrointestinal:  Positive for vomiting.  All other systems reviewed and are negative.  Physical Exam Updated Vital Signs BP 128/87   Pulse 77   Temp 98.4 F (36.9 C) (Oral)   Resp 18   Ht 5' 9.5" (1.765 m)   Wt 105.7 kg   SpO2 94%   BMI 33.91 kg/m  Physical Exam Vitals and nursing note reviewed.  Constitutional:      Appearance: Normal appearance.      Comments: Slightly dehydrated  HENT:     Head: Normocephalic.     Nose: Nose normal.     Mouth/Throat:     Mouth: Mucous membranes are dry.  Eyes:     Extraocular Movements: Extraocular movements intact.     Pupils: Pupils are equal, round, and reactive to light.  Cardiovascular:     Rate and Rhythm: Normal rate and regular rhythm.     Pulses: Normal pulses.     Heart sounds: Normal heart sounds.  Pulmonary:     Effort: Pulmonary effort is normal.     Breath sounds: Normal breath sounds.  Abdominal:     General: Abdomen is flat.     Palpations: Abdomen is soft.  Musculoskeletal:        General: Normal range of motion.     Cervical back: Normal range of motion and neck supple.  Skin:    General: Skin is warm.     Capillary Refill: Capillary refill takes less than 2 seconds.  Neurological:     General: No focal deficit present.     Mental Status: He is oriented to person, place, and time.  Psychiatric:        Mood and Affect: Mood normal.        Behavior: Behavior normal.  ED Results / Procedures / Treatments   Labs (all labs ordered are listed, but only abnormal results are displayed) Labs Reviewed  CBC - Abnormal; Notable for the following components:      Result Value   RBC 6.29 (*)    MCH 25.9 (*)    All other components within normal limits  BASIC METABOLIC PANEL - Abnormal; Notable for the following components:   Glucose, Bld 260 (*)    All other components within normal limits  CBG MONITORING, ED - Abnormal; Notable for the following components:   Glucose-Capillary 257 (*)    All other components within normal limits  TROPONIN I (HIGH SENSITIVITY)  TROPONIN I (HIGH SENSITIVITY)    EKG EKG Interpretation  Date/Time:  Thursday December 28 2021 16:51:44 EDT Ventricular Rate:  74 PR Interval:  161 QRS Duration: 80 QT Interval:  343 QTC Calculation: 381 R Axis:   -7 Text Interpretation: Sinus rhythm Low voltage, precordial leads Borderline ST elevation,  lateral leads No significant change since last tracing Confirmed by Wandra Arthurs [phone]) on 12/28/2021 4:54:03 PM  Radiology DG Chest 2 View  Result Date: 12/28/2021 CLINICAL DATA:  Hypoglycemic chest tightness with dizziness and nausea. EXAM: CHEST - 2 VIEW COMPARISON:  March 05, 2017. FINDINGS: The heart size and mediastinal contours are within normal limits. Both lungs are clear. The visualized skeletal structures are unremarkable. IMPRESSION: No active cardiopulmonary disease. Electronically Signed   By: Zetta Bills M.D.   On: 12/28/2021 14:59    Procedures Procedures    Medications Ordered in ED Medications  sodium chloride 0.9 % bolus 1,000 mL (1,000 mLs Intravenous New Bag/Given 12/28/21 1701)    ED Course/ Medical Decision Making/ A&P                           Medical Decision Making Billy Rios is a 56 y.o. male here presenting with hyperglycemia and chest pain.  Patient has not been taking his Trulicity recently.  I think hyperglycemia likely from medication noncompliance.  Low suspicion for DKA.  Chest pain can be from his vomiting as well and low risk for ACS.  Plan to get CBC and CMP and troponin x2 and chest x-ray.  Will hydrate patient and reassess  6:35 PM I reviewed patient's labs and independently reviewed his imaging.  Patient's initial glucose was 260.  His anion gap is normal.  Initial troponin was normal and delta Trope was negative.  He was given a liter bolus and his glucose went down to 186.  Patient has metformin at home.  I told him to continue taking until he gets his refill for Trulicity.  At this point he is stable for discharge.    Problems Addressed: Chest pain, unspecified type: acute illness or injury Hyperglycemia: acute illness or injury  Amount and/or Complexity of Data Reviewed Labs: ordered. Decision-making details documented in ED Course. Radiology: ordered and independent interpretation performed. Decision-making details documented  in ED Course. ECG/medicine tests: ordered and independent interpretation performed. Decision-making details documented in ED Course.    Final Clinical Impression(s) / ED Diagnoses Final diagnoses:  None    Rx / DC Orders ED Discharge Orders     None         Drenda Freeze, MD 12/28/21 1836

## 2021-12-28 NOTE — ED Triage Notes (Signed)
Pt arrived POV from home c/o not feeling well. Pt states he has type 2 diabetes and has been out of his medicine and now his CBG is high and he feels sick. Pt's CBG is 257.

## 2021-12-28 NOTE — ED Provider Triage Note (Signed)
Emergency Medicine Provider Triage Evaluation Note  Billy Rios , a 56 y.o. male  was evaluated in triage.  Pt complains of hyperglycemia.  Patient reports high blood sugar since Tuesday.  Patient reports he has been out of his Trulicity for the last 1 month.  Patient states that he has not gotten it filled because of "laziness".  Patient endorsing episode of chest pain yesterday, nausea, vomiting, excessive urination, excessive hunger, excessive thirst.  Review of Systems  Positive:  Negative:   Physical Exam  BP 123/81 (BP Location: Right Arm)   Pulse 84   Temp 98.4 F (36.9 C) (Oral)   Resp 16   Ht 5' 9.5" (1.765 m)   Wt 105.7 kg   SpO2 95%   BMI 33.91 kg/m  Gen:   Awake, no distress   Resp:  Normal effort  MSK:   Moves extremities without difficulty  Other:    Medical Decision Making  Medically screening exam initiated at 2:44 PM.  Appropriate orders placed.  Emmanuell Kantz was informed that the remainder of the evaluation will be completed by another provider, this initial triage assessment does not replace that evaluation, and the importance of remaining in the ED until their evaluation is complete.     Azucena Cecil, PA-C 12/28/21 1445

## 2021-12-28 NOTE — ED Notes (Signed)
Reviewed discharge instructions with patient and spouse. Follow-up care and medications reviewed. Patient and spouse verbalized understanding. Patient A&Ox4, VSS, and ambulatory with steady gait upon discharge.  °

## 2021-12-28 NOTE — Discharge Instructions (Signed)
Take your metformin as prescribed by your doctor.  Please refill your Trulicity.  See your doctor for follow up   Return to ER if you have worse chest pain, glucose > 500, vomiting

## 2022-01-15 ENCOUNTER — Telehealth: Payer: Self-pay | Admitting: *Deleted

## 2022-01-15 NOTE — Telephone Encounter (Signed)
Rec'd fax pt needing PA on Trulicity 0.75/0.'5mg'$ . submitted via cover-my-meds w/  (Key: [id number]). Rec'd msg your PA has been sent to [employer].0.92/[id number]$$[id number]$Marland Kitchen

## 2022-01-15 NOTE — Telephone Encounter (Signed)
Rec'd determination it states Med request has received a Favorable outcome. Effective 01/15/22 -through 01/16/2023. Faxed approval to [facility].Marland KitchenJohny Chess

## 2022-01-26 ENCOUNTER — Other Ambulatory Visit: Payer: Self-pay | Admitting: Emergency Medicine

## 2022-01-26 DIAGNOSIS — E1165 Type 2 diabetes mellitus with hyperglycemia: Secondary | ICD-10-CM

## 2022-06-08 ENCOUNTER — Ambulatory Visit: Payer: 59 | Admitting: Endocrinology

## 2022-11-14 ENCOUNTER — Encounter: Payer: Self-pay | Admitting: Emergency Medicine

## 2022-11-14 ENCOUNTER — Emergency Department
Admission: EM | Admit: 2022-11-14 | Discharge: 2022-11-14 | Disposition: A | Discharge status: Home / Self Care | Payer: 59 | Attending: Emergency Medicine | Admitting: Emergency Medicine

## 2022-11-14 DIAGNOSIS — E119 Type 2 diabetes mellitus without complications: Secondary | ICD-10-CM | POA: Insufficient documentation

## 2022-11-14 DIAGNOSIS — K644 Residual hemorrhoidal skin tags: Secondary | ICD-10-CM | POA: Diagnosis not present

## 2022-11-14 DIAGNOSIS — K6289 Other specified diseases of anus and rectum: Secondary | ICD-10-CM | POA: Diagnosis present

## 2022-11-14 MED ORDER — POLYETHYLENE GLYCOL 3350 17 G PO PACK: 17.0000 g | PACK | Freq: Every day | ORAL | 0 refills | Status: AC

## 2022-11-14 MED ORDER — HYDROCORTISONE 1 % EX CREA: 1.0000 | TOPICAL_CREAM | Freq: Two times a day (BID) | CUTANEOUS | 0 refills | Status: AC

## 2022-11-14 NOTE — Discharge Instructions (Addendum)
Start taking the miralax daily to reduce constipation.   Apply the hydrocortisone cream to the anus twice daily for one week to help shrink the hemorrhoid.

## 2022-11-14 NOTE — ED Provider Notes (Signed)
[facility] Provider Note    Event Date/Time   First MD Initiated Contact with Patient 11/14/22 2216     (approximate)   History   Hemorrhoids   HPI  Kaynan Klonowski is a 57 y.o. male past medical history of diabetes, hyperlipidemia who presents with rectal pain.  4 days ago patient was doing yard work when he felt a burning sensation in the rectal area.  Since that time he has had discomfort and pressure.  Still having normal bowel movements not having to strain.  Denies any bleeding.  No history of hemorrhoids or history of similar.     Past Medical History:  Diagnosis Date   Diabetes mellitus without complication    Hyperlipidemia    Mandible open fracture     Patient Active Problem List   Diagnosis Date Noted   Thyroid mass 09/28/2020   Type 2 diabetes mellitus with hyperglycemia, without long-term current use of insulin 07/01/2018     Physical Exam  Triage Vital Signs: ED Triage Vitals  Enc Vitals Group     BP 11/14/22 2129 (!) 144/92     Pulse Rate 11/14/22 2129 93     Resp 11/14/22 2129 18     Temp 11/14/22 2129 98.3 F (36.8 C)     Temp Source 11/14/22 2129 Oral     SpO2 11/14/22 2129 100 %     Weight 11/14/22 2128 234 lb 5.6 oz (106.3 kg)     Height 11/14/22 2128 5' 9.5" (1.765 m)     Head Circumference --      Peak Flow --      Pain Score 11/14/22 2127 6     Pain Loc --      Pain Edu? --      Excl. in GC? --     Most recent vital signs: Vitals:   11/14/22 2129  BP: (!) 144/92  Pulse: 93  Resp: 18  Temp: 98.3 F (36.8 C)  SpO2: 100%     General: Awake, no distress.  CV:  Good peripheral perfusion.  Resp:  Normal effort.  Abd:  No distention.  Neuro:             Awake, Alert, Oriented x 3  Other:  External hemorrhoid at the 1 o'clock position.  Mildly tender no erythema or fluctuance   ED Results / Procedures / Treatments  Labs (all labs ordered are listed, but only abnormal results are  displayed) Labs Reviewed - No data to display   EKG     RADIOLOGY    PROCEDURES:  Critical Care performed: No  Procedures   MEDICATIONS ORDERED IN ED: Medications - No data to display   IMPRESSION / MDM / ASSESSMENT AND PLAN / ED COURSE  I reviewed the triage vital signs and the nursing notes.                              Patient's presentation is most consistent with acute, uncomplicated illness.  Differential diagnosis includes, but is not limited to, external hemorrhoid, thrombosed hemorrhoid, exam not consistent with perianal abscess  Patient is a 56 year old male who presents with rectal pain x 4 days.  He noticed a discomfort and bulge in the rectal area after mowing the lawn it has had discomfort since.  Still having BMs.  Not straining no bleeding.  On exam patient has a external hemorrhoid that does not appear thrombosed  or infected its mildly tender.  Will prescribe MiraLAX recommended sitz baths and will prescribe hydrocortisone cream.  Recommend he follow-up with primary care.       FINAL CLINICAL IMPRESSION(S) / ED DIAGNOSES   Final diagnoses:  External hemorrhoid     Rx / DC Orders   ED Discharge Orders          Ordered    hydrocortisone cream 1 %  2 times daily        11/14/22 2255    polyethylene glycol (MIRALAX) 17 g packet  Daily        11/14/22 2255             Note:  This document was prepared using Dragon voice recognition software and may include unintentional dictation errors.   Georga Hacking, MD 11/14/22 2256

## 2022-11-14 NOTE — ED Triage Notes (Signed)
Pt presents ambulatory to triage via POV with complaints of rectal pain from hemorrhoids. He notes having "my intestines coming out my bottom". He notes it started 2 days ago and the pain has increased. A&Ox4 at this time. Denies AP, rectal bleeding, N/V/D, CP or SOB.

## 2022-11-15 ENCOUNTER — Telehealth: Payer: Self-pay

## 2022-11-15 NOTE — Transitions of Care (Post Inpatient/ED Visit) (Signed)
   11/15/2022  Name: Billy Rios MRN: [id number] DOB: [birth year]  Today's TOC FU Call Status: Today's TOC FU Call Status:: Successful TOC FU Call Competed TOC FU Call Complete Date: 11/15/22  Transition Care Management Follow-up Telephone Call Date of Discharge: 11/14/22 Discharge Facility: [facility]) Type of Discharge: Emergency Department Reason for ED Visit: Other: (hemorrhoidal skin tags) How have you been since you were released from the hospital?: Better Any questions or concerns?: No  Items Reviewed: Did you receive and understand the discharge instructions provided?: Yes Medications obtained and verified?: Yes (Medications Reviewed) Any new allergies since your discharge?: No Dietary orders reviewed?: Yes Do you have support at home?: No  Home Care and Equipment/Supplies: Were Home Health Services Ordered?: NA Any new equipment or medical supplies ordered?: NA  Functional Questionnaire: Do you need assistance with bathing/showering or dressing?: No Do you need assistance with meal preparation?: No Do you need assistance with eating?: No Do you have difficulty maintaining continence: No Do you need assistance with getting out of bed/getting out of a chair/moving?: No Do you have difficulty managing or taking your medications?: No  Follow up appointments reviewed: PCP Follow-up appointment confirmed?: Yes Date of PCP follow-up appointment?: 11/20/22 Follow-up Provider: [facility] Follow-up appointment confirmed?: NA Do you need transportation to your follow-up appointment?: No Do you understand care options if your condition(s) worsen?: Yes-patient verbalized understanding    SIGNATURE Karena Addison, LPN [facility] Nurse Health Advisor Direct Dial [phone]

## 2022-11-20 ENCOUNTER — Ambulatory Visit: Payer: 59 | Admitting: Emergency Medicine

## 2022-11-20 VITALS — BP 120/88 | HR 84 | Temp 98.5°F | Ht 69.0 in | Wt 206.0 lb

## 2022-11-20 DIAGNOSIS — E1169 Type 2 diabetes mellitus with other specified complication: Secondary | ICD-10-CM

## 2022-11-20 DIAGNOSIS — Z7984 Long term (current) use of oral hypoglycemic drugs: Secondary | ICD-10-CM | POA: Diagnosis not present

## 2022-11-20 DIAGNOSIS — E785 Hyperlipidemia, unspecified: Secondary | ICD-10-CM

## 2022-11-20 DIAGNOSIS — E1165 Type 2 diabetes mellitus with hyperglycemia: Secondary | ICD-10-CM

## 2022-11-20 DIAGNOSIS — Z7985 Long-term (current) use of injectable non-insulin antidiabetic drugs: Secondary | ICD-10-CM | POA: Diagnosis not present

## 2022-11-20 LAB — POCT GLYCOSYLATED HEMOGLOBIN (HGB A1C): Hemoglobin A1C: 13.8 % — AB (ref 4.0–5.6)

## 2022-11-20 MED ORDER — ROSUVASTATIN CALCIUM 10 MG PO TABS: 10.0000 mg | ORAL_TABLET | Freq: Every day | ORAL | 3 refills | Status: AC

## 2022-11-20 MED ORDER — TRULICITY 1.5 MG/0.5ML ~~LOC~~ SOAJ: 1.5000 mg | SUBCUTANEOUS | 5 refills | Status: AC

## 2022-11-20 MED ORDER — METFORMIN HCL 500 MG PO TABS: 500.0000 mg | ORAL_TABLET | Freq: Two times a day (BID) | ORAL | 3 refills | Status: AC

## 2022-11-20 MED ORDER — EMPAGLIFLOZIN 10 MG PO TABS: 10.0000 mg | ORAL_TABLET | Freq: Every day | ORAL | 3 refills | Status: AC

## 2022-11-20 NOTE — Patient Instructions (Signed)

## 2022-11-20 NOTE — Assessment & Plan Note (Signed)
Uncontrolled diabetes with hemoglobin A1c of 13.8. Has been off medications.  Started Trulicity 2 weeks ago. Cardiovascular risks associated with uncontrolled diabetes discussed Diet and nutrition discussed Recommend to continue Trulicity at a higher dose of 1.5 mg weekly Restart metformin 500 mg twice a day start either Jardiance or Farxiga as per insurance's formulary Recommend to start rosuvastatin 10 mg Blood work and urine done today. Follow-up in 3 months.

## 2022-11-20 NOTE — Progress Notes (Signed)
Billy Rios 57 y.o.   Chief Complaint  Patient presents with   Transitions Of Care    Pt states went to the ED for a hemorrhoid. And wants to just follow up.     HISTORY OF PRESENT ILLNESS: This is a 57 y.o. male here for emergency department follow-up of hemorrhoid.  Doing well.  Needs colonoscopy referral History of diabetes, hypertension and dyslipidemia.  Here for follow-up. No other complaints or medical concerns today.  HPI   Prior to Admission medications   Medication Sig Start Date End Date Taking? Authorizing Provider  Dulaglutide (TRULICITY) 0.75 MG/0.5ML SOPN INJECT 0.75 MG INTO THE SKIN ONCE A WEEK. 01/26/22  Yes Stepheni Cameron, Eilleen Kempf, MD  hydrocortisone cream 1 % Apply 1 Application topically 2 (two) times daily for 7 days. 11/14/22 11/21/22 Yes Georga Hacking, MD  polyethylene glycol (MIRALAX) 17 g packet Take 17 g by mouth daily. 11/14/22  Yes Georga Hacking, MD  metFORMIN (GLUCOPHAGE) 500 MG tablet Take 1 tablet (500 mg total) by mouth 2 (two) times daily with a meal. Patient not taking: Reported on 01/17/2021 06/29/20   Georgina Quint, MD  rosuvastatin (CRESTOR) 10 MG tablet Take 1 tablet (10 mg total) by mouth daily. Patient not taking: Reported on 01/17/2021 06/29/20   Georgina Quint, MD    Allergies  Allergen Reactions   Iohexol Hives    Patient Active Problem List   Diagnosis Date Noted   Thyroid mass 09/28/2020   Type 2 diabetes mellitus with hyperglycemia, without long-term current use of insulin 07/01/2018    Past Medical History:  Diagnosis Date   Diabetes mellitus without complication    Hyperlipidemia    Mandible open fracture     Past Surgical History:  Procedure Laterality Date   FACIAL FRACTURE SURGERY     KNEE ARTHROSCOPY Left     Social History   Socioeconomic History   Marital status: Married    Spouse name: Not on file   Number of children: Not on file   Years of education: Not on file   Highest  education level: Bachelor's degree (e.g., BA, AB, BS)  Occupational History   Not on file  Tobacco Use   Smoking status: Never   Smokeless tobacco: Never  Vaping Use   Vaping Use: Never used  Substance and Sexual Activity   Alcohol use: No   Drug use: No   Sexual activity: Yes    Birth control/protection: None  Other Topics Concern   Not on file  Social History Narrative   Not on file   Social Determinants of Health   Financial Resource Strain: Low Risk  (11/20/2022)   Overall Financial Resource Strain (CARDIA)    Difficulty of Paying Living Expenses: Not hard at all  Food Insecurity: No Food Insecurity (11/20/2022)   Hunger Vital Sign    Worried About Running Out of Food in the Last Year: Never true    Ran Out of Food in the Last Year: Never true  Transportation Needs: No Transportation Needs (11/20/2022)   PRAPARE - Administrator, Civil Service (Medical): No    Lack of Transportation (Non-Medical): No  Physical Activity: Insufficiently Active (11/20/2022)   Exercise Vital Sign    Days of Exercise per Week: 3 days    Minutes of Exercise per Session: 30 min  Stress: Stress Concern Present (11/20/2022)   [employer] of Occupational Health - Occupational Stress Questionnaire    Feeling of Stress :  To some extent  Social Connections: Socially Integrated (11/20/2022)   Social Connection and Isolation Panel [NHANES]    Frequency of Communication with Friends and Family: More than three times a week    Frequency of Social Gatherings with Friends and Family: Once a week    Attends Religious Services: More than 4 times per year    Active Member of [employer] or Organizations: Yes    Attends Engineer, structural: More than 4 times per year    Marital Status: Married  Catering manager Violence: Not on file    Family History  Problem Relation Age of Onset   Diabetes Father    Hypertension Father    Diabetes Paternal Grandmother    Cancer Paternal Grandmother     Diabetes Brother    Hypertension Brother    Colon cancer Neg Hx    Colon polyps Neg Hx    Esophageal cancer Neg Hx    Rectal cancer Neg Hx    Stomach cancer Neg Hx    Thyroid disease Neg Hx      Review of Systems  Constitutional:  Negative for chills and fever.  HENT: Negative.  Negative for congestion and sore throat.   Respiratory: Negative.  Negative for cough and shortness of breath.   Cardiovascular: Negative.  Negative for chest pain and palpitations.  Gastrointestinal:  Negative for abdominal pain, diarrhea, nausea and vomiting.  Genitourinary: Negative.  Negative for dysuria and hematuria.  Skin: Negative.  Negative for rash.  Neurological: Negative.  Negative for dizziness and headaches.  All other systems reviewed and are negative.   Vitals:   11/20/22 1333  BP: 120/88  Pulse: 84  Temp: 98.5 F (36.9 C)  SpO2: 97%    Physical Exam Vitals reviewed.  Constitutional:      Appearance: Normal appearance.  HENT:     Head: Normocephalic.     Mouth/Throat:     Mouth: Mucous membranes are moist.     Pharynx: Oropharynx is clear.  Eyes:     Extraocular Movements: Extraocular movements intact.     Pupils: Pupils are equal, round, and reactive to light.  Cardiovascular:     Rate and Rhythm: Normal rate and regular rhythm.     Pulses: Normal pulses.     Heart sounds: Normal heart sounds.  Pulmonary:     Effort: Pulmonary effort is normal.     Breath sounds: Normal breath sounds.  Musculoskeletal:     Cervical back: No tenderness.  Lymphadenopathy:     Cervical: No cervical adenopathy.  Skin:    General: Skin is warm and dry.     Capillary Refill: Capillary refill takes less than 2 seconds.  Neurological:     General: No focal deficit present.     Mental Status: He is alert and oriented to person, place, and time.  Psychiatric:        Mood and Affect: Mood normal.        Behavior: Behavior normal.    Results for orders placed or performed in visit on  11/20/22 (from the past 24 hour(s))  POCT HgB A1C     Status: Abnormal   Collection Time: 11/20/22  2:16 PM  Result Value Ref Range   Hemoglobin A1C 13.8 (A) 4.0 - 5.6 %   HbA1c POC (<> result, manual entry)     HbA1c, POC (prediabetic range)     HbA1c, POC (controlled diabetic range)       ASSESSMENT & PLAN: A total  of 47 minutes was spent with the patient and counseling/coordination of care regarding preparing for this visit, review of most recent office visit notes, review of most recent blood work results including interpretation of today's hemoglobin A1c, cardiovascular risk associated with uncontrolled diabetes, review of all medications and changes made, education on nutrition, need for blood work today, prognosis, documentation, and need for follow-up.  Problem List Items Addressed This Visit       Endocrine   Dyslipidemia associated with type 2 diabetes mellitus - Primary    Uncontrolled diabetes with hemoglobin A1c of 13.8. Has been off medications.  Started Trulicity 2 weeks ago. Cardiovascular risks associated with uncontrolled diabetes discussed Diet and nutrition discussed Recommend to continue Trulicity at a higher dose of 1.5 mg weekly Restart metformin 500 mg twice a day start either Jardiance or Farxiga as per insurance's formulary Recommend to start rosuvastatin 10 mg Blood work and urine done today. Follow-up in 3 months.      Relevant Medications   Dulaglutide (TRULICITY) 1.5 MG/0.5ML SOPN   metFORMIN (GLUCOPHAGE) 500 MG tablet   empagliflozin (JARDIANCE) 10 MG TABS tablet   rosuvastatin (CRESTOR) 10 MG tablet   Other Visit Diagnoses     Type 2 diabetes mellitus with hyperglycemia, without long-term current use of insulin       Relevant Medications   Dulaglutide (TRULICITY) 1.5 MG/0.5ML SOPN   metFORMIN (GLUCOPHAGE) 500 MG tablet   empagliflozin (JARDIANCE) 10 MG TABS tablet   rosuvastatin (CRESTOR) 10 MG tablet   Other Relevant Orders   POCT HgB A1C  (Completed)      Patient Instructions  Diabetes Mellitus and Nutrition, Adult When you have diabetes, or diabetes mellitus, it is very important to have healthy eating habits because your blood sugar (glucose) levels are greatly affected by what you eat and drink. Eating healthy foods in the right amounts, at about the same times every day, can help you: Manage your blood glucose. Lower your risk of heart disease. Improve your blood pressure. Reach or maintain a healthy weight. What can affect my meal plan? Every person with diabetes is different, and each person has different needs for a meal plan. Your health care provider may recommend that you work with a dietitian to make a meal plan that is best for you. Your meal plan may vary depending on factors such as: The calories you need. The medicines you take. Your weight. Your blood glucose, blood pressure, and cholesterol levels. Your activity level. Other health conditions you have, such as heart or kidney disease. How do carbohydrates affect me? Carbohydrates, also called carbs, affect your blood glucose level more than any other type of food. Eating carbs raises the amount of glucose in your blood. It is important to know how many carbs you can safely have in each meal. This is different for every person. Your dietitian can help you calculate how many carbs you should have at each meal and for each snack. How does alcohol affect me? Alcohol can cause a decrease in blood glucose (hypoglycemia), especially if you use insulin or take certain diabetes medicines by mouth. Hypoglycemia can be a life-threatening condition. Symptoms of hypoglycemia, such as sleepiness, dizziness, and confusion, are similar to symptoms of having too much alcohol. Do not drink alcohol if: Your health care provider tells you not to drink. You are pregnant, may be pregnant, or are planning to become pregnant. If you drink alcohol: Limit how much you have to: 0-1  drink a day for  women. 0-2 drinks a day for men. Know how much alcohol is in your drink. In the [place] one drink equals one 12 oz bottle of beer (355 mL), one 5 oz glass of wine (148 mL), or one 1 oz glass of hard liquor (44 mL). Keep yourself hydrated with water, diet soda, or unsweetened iced tea. Keep in mind that regular soda, juice, and other mixers may contain a lot of sugar and must be counted as carbs. What are tips for following this plan?  Reading food labels Start by checking the serving size on the Nutrition Facts label of packaged foods and drinks. The number of calories and the amount of carbs, fats, and other nutrients listed on the label are based on one serving of the item. Many items contain more than one serving per package. Check the total grams (g) of carbs in one serving. Check the number of grams of saturated fats and trans fats in one serving. Choose foods that have a low amount or none of these fats. Check the number of milligrams (mg) of salt (sodium) in one serving. Most people should limit total sodium intake to less than 2,300 mg per day. Always check the nutrition information of foods labeled as "low-fat" or "nonfat." These foods may be higher in added sugar or refined carbs and should be avoided. Talk to your dietitian to identify your daily goals for nutrients listed on the label. Shopping Avoid buying canned, pre-made, or processed foods. These foods tend to be high in fat, sodium, and added sugar. Shop around the outside edge of the grocery store. This is where you will most often find fresh fruits and vegetables, bulk grains, fresh meats, and fresh dairy products. Cooking Use low-heat cooking methods, such as baking, instead of high-heat cooking methods, such as deep frying. Cook using healthy oils, such as olive, canola, or sunflower oil. Avoid cooking with butter, cream, or high-fat meats. Meal planning Eat meals and snacks regularly, preferably at the same  times every day. Avoid going long periods of time without eating. Eat foods that are high in fiber, such as fresh fruits, vegetables, beans, and whole grains. Eat 4-6 oz (112-168 g) of lean protein each day, such as lean meat, chicken, fish, eggs, or tofu. One ounce (oz) (28 g) of lean protein is equal to: 1 oz (28 g) of meat, chicken, or fish. 1 egg.  cup (62 g) of tofu. Eat some foods each day that contain healthy fats, such as avocado, nuts, seeds, and fish. What foods should I eat? Fruits Berries. Apples. Oranges. Peaches. Apricots. Plums. Grapes. Mangoes. Papayas. Pomegranates. Kiwi. Cherries. Vegetables Leafy greens, including lettuce, spinach, kale, chard, collard greens, mustard greens, and cabbage. Beets. Cauliflower. Broccoli. Carrots. Green beans. Tomatoes. Peppers. Onions. Cucumbers. Brussels sprouts. Grains Whole grains, such as whole-wheat or whole-grain bread, crackers, tortillas, cereal, and pasta. Unsweetened oatmeal. Quinoa. Brown or wild rice. Meats and other proteins Seafood. Poultry without skin. Lean cuts of poultry and beef. Tofu. Nuts. Seeds. Dairy Low-fat or fat-free dairy products such as milk, yogurt, and cheese. The items listed above may not be a complete list of foods and beverages you can eat and drink. Contact a dietitian for more information. What foods should I avoid? Fruits Fruits canned with syrup. Vegetables Canned vegetables. Frozen vegetables with butter or cream sauce. Grains Refined white flour and flour products such as bread, pasta, snack foods, and cereals. Avoid all processed foods. Meats and other proteins Fatty cuts of meat. Poultry with  skin. Breaded or fried meats. Processed meat. Avoid saturated fats. Dairy Full-fat yogurt, cheese, or milk. Beverages Sweetened drinks, such as soda or iced tea. The items listed above may not be a complete list of foods and beverages you should avoid. Contact a dietitian for more information. Questions  to ask a health care provider Do I need to meet with a certified diabetes care and education specialist? Do I need to meet with a dietitian? What number can I call if I have questions? When are the best times to check my blood glucose? Where to find more information: American Diabetes Association: diabetes.org Academy of Nutrition and Dietetics: eatright.[employer] of Diabetes and Digestive and Kidney Diseases: StageSync.si Association of Diabetes Care & Education Specialists: diabeteseducator.org Summary It is important to have healthy eating habits because your blood sugar (glucose) levels are greatly affected by what you eat and drink. It is important to use alcohol carefully. A healthy meal plan will help you manage your blood glucose and lower your risk of heart disease. Your health care provider may recommend that you work with a dietitian to make a meal plan that is best for you. This information is not intended to replace advice given to you by your health care provider. Make sure you discuss any questions you have with your health care provider. Document Revised: 02/17/2020 Document Reviewed: 02/17/2020 Elsevier Patient Education  2023 Elsevier Inc.    Edwina Barth, MD [facility]

## 2022-12-06 ENCOUNTER — Other Ambulatory Visit: Payer: Self-pay | Admitting: Emergency Medicine

## 2022-12-06 DIAGNOSIS — E1165 Type 2 diabetes mellitus with hyperglycemia: Secondary | ICD-10-CM

## 2023-02-19 ENCOUNTER — Ambulatory Visit: Payer: 59 | Admitting: Emergency Medicine

## 2023-06-19 ENCOUNTER — Encounter: Payer: Self-pay | Admitting: Gastroenterology
